# Patient Record
Sex: Male | Born: 1940 | Race: White | Hispanic: No | Smoking: Former smoker
Health system: Southern US, Community
[De-identification: ages and names within clinical notes are randomized; demographics above are authoritative.]

## PROBLEM LIST (undated history)

## (undated) DIAGNOSIS — C44319 Basal cell carcinoma of skin of other parts of face: Secondary | ICD-10-CM

## (undated) DIAGNOSIS — M5136 Other intervertebral disc degeneration, lumbar region: Secondary | ICD-10-CM

## (undated) DIAGNOSIS — M545 Other chronic pain: Secondary | ICD-10-CM

## (undated) DIAGNOSIS — M1990 Unspecified osteoarthritis, unspecified site: Secondary | ICD-10-CM

## (undated) DIAGNOSIS — G8929 Other chronic pain: Secondary | ICD-10-CM

## (undated) DIAGNOSIS — R51 Headache: Secondary | ICD-10-CM

## (undated) DIAGNOSIS — R251 Tremor, unspecified: Secondary | ICD-10-CM

## (undated) DIAGNOSIS — M51369 Other intervertebral disc degeneration, lumbar region without mention of lumbar back pain or lower extremity pain: Secondary | ICD-10-CM

## (undated) DIAGNOSIS — E785 Hyperlipidemia, unspecified: Secondary | ICD-10-CM

## (undated) DIAGNOSIS — I714 Abdominal aortic aneurysm, without rupture, unspecified: Secondary | ICD-10-CM

## (undated) DIAGNOSIS — M6530 Trigger finger, unspecified finger: Secondary | ICD-10-CM

## (undated) DIAGNOSIS — R519 Headache, unspecified: Secondary | ICD-10-CM

## (undated) DIAGNOSIS — Z87442 Personal history of urinary calculi: Secondary | ICD-10-CM

## (undated) HISTORY — PX: MENISCUS REPAIR: SHX5179

## (undated) HISTORY — DX: Tremor, unspecified: R25.1

## (undated) HISTORY — PX: ABDOMINAL AORTIC ANEURYSM REPAIR: SUR1152

## (undated) HISTORY — DX: Low back pain: M54.5

## (undated) HISTORY — DX: Other chronic pain: M54.50

## (undated) HISTORY — DX: Unspecified osteoarthritis, unspecified site: M19.90

## (undated) HISTORY — DX: Trigger finger, unspecified finger: M65.30

## (undated) HISTORY — PX: COLONOSCOPY: SHX174

## (undated) HISTORY — DX: Abdominal aortic aneurysm, without rupture, unspecified: I71.40

## (undated) HISTORY — PX: MOHS SURGERY: SUR867

## (undated) HISTORY — DX: Other chronic pain: G89.29

## (undated) HISTORY — DX: Hyperlipidemia, unspecified: E78.5

## (undated) HISTORY — DX: Abdominal aortic aneurysm, without rupture: I71.4

---

## 2000-06-19 ENCOUNTER — Encounter: Admission: RE | Admit: 2000-06-19 | Discharge: 2000-06-19 | Payer: Self-pay | Admitting: Family Medicine

## 2000-06-19 ENCOUNTER — Encounter: Payer: Self-pay | Admitting: Family Medicine

## 2008-07-26 ENCOUNTER — Encounter: Admission: RE | Admit: 2008-07-26 | Discharge: 2008-07-26 | Payer: Self-pay | Admitting: Family Medicine

## 2008-08-17 ENCOUNTER — Ambulatory Visit: Payer: Self-pay | Admitting: Vascular Surgery

## 2008-10-11 ENCOUNTER — Encounter: Payer: Self-pay | Admitting: Emergency Medicine

## 2008-10-11 ENCOUNTER — Ambulatory Visit (HOSPITAL_COMMUNITY): Admission: AD | Admit: 2008-10-11 | Discharge: 2008-10-11 | Payer: Self-pay | Admitting: Urology

## 2008-10-26 ENCOUNTER — Ambulatory Visit (HOSPITAL_BASED_OUTPATIENT_CLINIC_OR_DEPARTMENT_OTHER): Admission: RE | Admit: 2008-10-26 | Discharge: 2008-10-26 | Payer: Self-pay | Admitting: Urology

## 2009-03-31 ENCOUNTER — Ambulatory Visit: Payer: Self-pay | Admitting: Vascular Surgery

## 2009-10-24 DIAGNOSIS — M1990 Unspecified osteoarthritis, unspecified site: Secondary | ICD-10-CM

## 2009-10-24 HISTORY — DX: Unspecified osteoarthritis, unspecified site: M19.90

## 2009-11-03 ENCOUNTER — Ambulatory Visit: Payer: Self-pay | Admitting: Vascular Surgery

## 2010-03-26 DIAGNOSIS — C44319 Basal cell carcinoma of skin of other parts of face: Secondary | ICD-10-CM

## 2010-03-26 HISTORY — DX: Basal cell carcinoma of skin of other parts of face: C44.319

## 2010-03-26 HISTORY — PX: MOHS SURGERY: SUR867

## 2010-04-16 ENCOUNTER — Encounter: Payer: Self-pay | Admitting: Vascular Surgery

## 2010-05-03 ENCOUNTER — Ambulatory Visit: Payer: Self-pay | Admitting: Vascular Surgery

## 2010-05-03 ENCOUNTER — Encounter (INDEPENDENT_AMBULATORY_CARE_PROVIDER_SITE_OTHER): Payer: Medicare Other

## 2010-05-03 ENCOUNTER — Ambulatory Visit (INDEPENDENT_AMBULATORY_CARE_PROVIDER_SITE_OTHER): Payer: Medicare Other | Admitting: Vascular Surgery

## 2010-05-03 DIAGNOSIS — I714 Abdominal aortic aneurysm, without rupture: Secondary | ICD-10-CM

## 2010-05-05 NOTE — Assessment & Plan Note (Addendum)
OFFICE VISIT  [name] [name], [name] [name] DOB:  02/14/1941                                       05/03/2010 KGMWN#:[id number]  I saw this patient in the office today for continued follow-up of his abdominal aortic aneurysm.  I last saw him in August of 2011.  At that time it measured 4.1 cm in maximum diameter.  Since I saw him last, he has had no abdominal or back pain.  PAST MEDICAL HISTORY:  This has not changed since I saw him last.  He has a history of mildly elevated cholesterol which has been well- controlled on his current medications.  He is followed by Dr. Buren Kos.  He had a history of some chronic low back pain in the past related to degenerative disk disease but lately his back has not been bothering him significantly.  SOCIAL HISTORY:  He is married.  He has 2 children.  He quit tobacco approximately 12 years ago.  REVIEW OF SYSTEMS:  CARDIOVASCULAR:  He had no chest pain, chest pressure, palpitations or arrhythmias.  He has had no claudication, rest pain or nonhealing ulcers.  He has had no history of DVT or phlebitis. PULMONARY:  He had no productive cough bronchitis, asthma or wheezing.  PHYSICAL EXAMINATION:  This is a pleasant 70 year old gentleman who appears his stated age.  Blood pressure is 115/76, heart rate is 64, respiratory rate is 12.  Lungs:  Clear bilaterally to auscultation without rales, rhonchi or wheezing.  Cardiovascular exam:  I do not detect any carotid bruits.  He has a regular rate and rhythm.  He has palpable femoral, popliteal, posterior tibial pulses bilaterally. Abdomen:  Soft and nontender with normal pitched bowel sounds.  His aneurysm is palpable and nontender.  Neurologic exam:  He has no focal weakness or paresthesias.  I did independently interpret the duplex of his aneurysm which shows that the maximum diameter is 4.14 cm.  This has not changed over the last 6 months.  Given that the aneurysm is stable in  size and is fairly small,  I think it is safe to extend his follow-up out to 1 year.  I have ordered a follow-up duplex of his aneurysm in 1 year.  I will plan on seeing him back in 2 years unless there has been any significant change in the size of his aneurysm in his 1 year follow-up visit.  He understands we would typically consider elective repair at 5.5 cm.    Di Kindle. Edilia Bo, M.D. Electronically Signed  CSD/MEDQ  D:  05/03/2010  T:  05/04/2010  Job:  [id number]  cc:   Kari Baars, M.D.

## 2010-05-10 NOTE — Procedures (Unsigned)
DUPLEX ULTRASOUND OF ABDOMINAL AORTA  INDICATION:  Follow up AAA.  HISTORY: Diabetes:  No. Cardiac:  No. Hypertension:  No. Smoking:  Quit 14 years ago. Connective Tissue Disorder: Family History:  No. Previous Surgery:  No.  DUPLEX EXAM:         AP (cm)                   TRANSVERSE (cm) Proximal             2.09 cm                   2.07 cm Mid                  3.76 cm                   4.14 cm Distal               2.27 cm                   2.78 cm Right Iliac          1.31 cm                   1.14 cm Left Iliac           1.11 cm                   1.09 cm  PREVIOUS:  Date: 11/03/2009  AP:  3.7  TRANSVERSE:  4.1  IMPRESSION: 1. Abdominal aortic aneurysm with largest diameter of 3.76 X 4.14 cm. 2. Appears stable in comparison to the previous study.  ___________________________________________ Di Kindle. Edilia Bo, M.D.  EM/MEDQ  D:  05/03/2010  T:  05/03/2010  Job:  [id number]

## 2010-07-01 LAB — URINALYSIS, MICROSCOPIC ONLY
Bilirubin Urine: NEGATIVE
Glucose, UA: NEGATIVE mg/dL
Ketones, ur: NEGATIVE mg/dL
Specific Gravity, Urine: 1.019 (ref 1.005–1.030)
pH: 5 (ref 5.0–8.0)

## 2010-07-01 LAB — POCT I-STAT 4, (NA,K, GLUC, HGB,HCT)
Hemoglobin: 15.3 g/dL (ref 13.0–17.0)
Sodium: 142 mEq/L (ref 135–145)

## 2010-07-02 LAB — URINALYSIS, ROUTINE W REFLEX MICROSCOPIC
Glucose, UA: NEGATIVE mg/dL
Ketones, ur: 15 mg/dL — AB
Protein, ur: NEGATIVE mg/dL
pH: 5 (ref 5.0–8.0)

## 2010-07-02 LAB — POCT I-STAT, CHEM 8
BUN: 21 mg/dL (ref 6–23)
Calcium, Ion: 1.11 mmol/L — ABNORMAL LOW (ref 1.12–1.32)
Chloride: 100 mEq/L (ref 96–112)
Creatinine, Ser: 1.7 mg/dL — ABNORMAL HIGH (ref 0.4–1.5)
Glucose, Bld: 162 mg/dL — ABNORMAL HIGH (ref 70–99)
TCO2: 25 mmol/L (ref 0–100)

## 2010-07-02 LAB — CBC
HCT: 44.6 % (ref 39.0–52.0)
Hemoglobin: 15.4 g/dL (ref 13.0–17.0)
RBC: 4.72 MIL/uL (ref 4.22–5.81)
RDW: 13.6 % (ref 11.5–15.5)

## 2010-07-02 LAB — DIFFERENTIAL
Basophils Absolute: 0 10*3/uL (ref 0.0–0.1)
Eosinophils Relative: 0 % (ref 0–5)
Lymphocytes Relative: 6 % — ABNORMAL LOW (ref 12–46)
Lymphs Abs: 0.8 10*3/uL (ref 0.7–4.0)
Monocytes Absolute: 0.6 10*3/uL (ref 0.1–1.0)
Monocytes Relative: 5 % (ref 3–12)
Neutro Abs: 12.4 10*3/uL — ABNORMAL HIGH (ref 1.7–7.7)

## 2010-07-02 LAB — URINE CULTURE: Culture: NO GROWTH

## 2010-07-02 LAB — URINE MICROSCOPIC-ADD ON

## 2010-08-08 NOTE — Op Note (Signed)
[name], [name]              ACCOUNT NO.:  [id number]   MEDICAL RECORD NO.:  [id number]          PATIENT TYPE:  AMB   LOCATION:  DAY                          FACILITY[facility]   PHYSICIAN:  Courtney Paris, M.D.DATE OF BIRTH:  [birth year]   DATE OF PROCEDURE:  10/11/2008  DATE OF DISCHARGE:  10/11/2008                               OPERATIVE REPORT   PREOPERATIVE DIAGNOSIS:  Left hydronephrosis number, left ureteral  calculus, abdominal aortic aneurysm.   POSTOPERATIVE DIAGNOSIS:  Left hydronephrosis number, left ureteral  calculus, abdominal aortic aneurysm.   OPERATION:  Cystoscopy, left retrograde pyelogram, insertion of left  ureteral stent.   ANESTHESIA:  General.   SURGEON:  Courtney Paris, M.D.   BRIEF HISTORY:  This 70 year old patient was referred from the emergency  room at [facility] where he had gone for continued left lower  abdominal pain.  He had been to Urgent Care the day before and thought  to have diverticulitis.  The pain, nausea, and vomiting continued.  CT  scan showed a 7-mm stone in the left mid to distal the ureter with  hydronephrosis and he was sent for evaluation.  He had not had previous  stones before but does have a fairly large abdominal aortic aneurysm.  He is admitted now for cystoscopy, stent insertion after retrograde.   The patient was placed on the operating table in the dorsal lithotomy  position after satisfactory induction of general anesthesia.  Time-out  was then performed and the patient and procedure then reconfirmed.  He  had been on Cipro, so IV Cipro was not given.  A 21 panendoscope was  passed down the urethra.  No anterior strictures were seen.  His  prostate was mildly enlarged.  The bladder was entered.  There were a  lot of yellowish stones that were layered in the bladder posteriorly.  The orifices looked normal.  No bladder mucosal lesions seen.  The left  ureteral orifice was catheterized with a 6  open-ended ureteral catheter  and an occlusive retrograde was done.  This showed some hydronephrosis  down to a junction at the middle and lower third of the ureter, where it  seemed to taper.  There was a filling defect up in the renal pelvis,  possibly the stone that was pushed back.  All this was seen under  fluoroscopy.   A guidewire was then passed through the open-ended ureteral stent, which  was then removed, and over the guidewire a 6-French x 24-cm length  double-J ureteral stent was then passed.  When the guidewire was  removed, there was a nice coil in the renal pelvis and one in the  bladder.  This was adjusted slightly with grasping forceps, the bladder  drained and a B and O suppository inserted, and the patient taken to the  recovery room in good condition to be later discharged as an outpatient.  He will come back in a week after being off aspirin and will see if we  can see a stone on a plain x-ray and then schedule lithotripsy if  appropriate.  The stone  may be uric acid since he those yellow crystals  in the bladder, but will have to see if we can see the stone on a KUB.     Courtney Paris, M.D.  Electronically Signed    HMK/MEDQ  D:  10/11/2008  T:  10/12/2008  Job:  [id number]

## 2010-08-08 NOTE — Op Note (Signed)
[name] [name], [name] [name]              ACCOUNT NO.:  [id number]   MEDICAL RECORD NO.:  [id number]          PATIENT TYPE:  AMB   LOCATION:  [facility]                         FACILITY:  [facility]   PHYSICIAN:  Courtney Paris, M.D.DATE OF BIRTH:  1940/03/29   DATE OF PROCEDURE:  10/26/2008  DATE OF DISCHARGE:                               OPERATIVE REPORT   PREOPERATIVE DIAGNOSIS:  Left ureteral stone.   POSTOPERATIVE DIAGNOSIS:  Left ureteral stone.   PROCEDURE:  Left retrograde pyelogram and ureteroscopy.   ANESTHESIA:  General.   SURGEON:  Courtney Paris, M.D.   BRIEF HISTORY:  This 70 year old patient is admitted with recurrent pain  from a radiolucent stone.  He first presented on 10/11/2008 with a 2-day  history of left flank pain, had hydronephrosis and a stone that had been  seen in the mid ureter on CT scan was not seen on retrograde.  The stent  was removed on October 19, 2008 and he did well for a couple of days but  then had a lot more pain in the last couple of days.  CT scan with the  stent showed a 5-mm proximal left ureteral stone.  Again, the stone was  not seen on a KUB today but he enters now for ureteroscopy and stone  removal if possible.   The patient was placed on the operating table in the dorsal lithotomy  position.  After satisfactory induction of general anesthesia, B and O  suppository was inserted and he was given IV Cipro.  Time-out was then  performed and the patient and the procedure were then reconfirmed.  A 21-  panendoscope was passed down the anterior urethra which was normal.  Posterior urethra showed mild BPH and the bladder was entered.  The  bladder was free of any mucosal lesions.  The left orifice was a little  bit edematous and slightly red but otherwise normal.  A 6 open-ended  ureteral catheter was inserted and an occlusive retrograde was done.   This demonstrated what looked like a possible filling defect and some  folds in the distal  left ureter just outside the ureteral vesicle  junction but no other filling defects within the ureter were seen.  A  guidewire was then passed up through the open-ended catheter under  fluoroscopy to the level of the kidney and the open-ended catheter was  then removed.  Over the guidewire, the inner cannula of a ureteral  access sheath was then used to dilate the distal ureter.   A 6 short ureteroscope was then passed into the ureter and all the way  up to the kidney.  No stone was seen.  The folds of tissue in the distal  left ureter were just that and no stone was seen.  Two complete passes  all the way up to the ureteropelvic junction was done, but again no  stone was seen.  The scope was removed, bladder drained and the  guidewire removed and no stent was left.  The patient was then taken to  the recovery room in good condition to be later discharged as  an  outpatient.  If he does have a stone, perhaps it will pass since he has  been dilated but no stone was seen within the ureter at this time.      Courtney Paris, M.D.  Electronically Signed     HMK/MEDQ  D:  10/26/2008  T:  10/26/2008  Job:  [id number]

## 2010-08-08 NOTE — H&P (Signed)
[name] [name], [name] [name]              ACCOUNT NO.:  [id number]   MEDICAL RECORD NO.:  [id number]          PATIENT TYPE:  AMB   LOCATION:  DAY                          FACILITY:  [facility]   PHYSICIAN:  Courtney Paris, M.D.DATE OF BIRTH:  09/11/1940   DATE OF ADMISSION:  10/11/2008  DATE OF DISCHARGE:                              HISTORY & PHYSICAL   This 70 year old patient was seen in Urgent Care on October 10, 2008 for  left lower quadrant pain thought to be diverticulitis.  When his pain,  nausea and vomiting continued he went back to the ER where a CT scan now  shows a 7 mm left mid to distal ureteral stone with hydronephrosis.  He  had never had stones before.  He was sent for evaluation.   His medicines include:  1. Simvastatin 25 mg every 3 days.  2. Baby aspirin.   ALLERGIES:  He has no allergies.   His only operations were knee operations times two many years ago.   REVIEW OF SYSTEMS:  A 12 point review of systems was negative.  No heart  or lung problems.  No GI complaints, no voiding symptoms.  No previous  stones.   FAMILY HISTORY AND SOCIAL HISTORY:  He is married, just retired a month  ago.   PHYSICAL EXAM:  VITAL SIGNS:  Stable.  He is a healthy white male in no  acute distress complaining of left flank pain.  HEENT:  Clear.  CHEST: Clear to percussion auscultation.  No murmurs.  ABDOMEN:  Soft with no CVA tenderness to fist percussion.  No abdominal  masses.  GENITALIA:  Normal.  Bilaterally descended testes.  Prostate small and  benign.  EXTREMITIES:  Negative.  No edema.  Good distal pulses.   CT scan was reviewed and results as above.   IMPRESSION:  1. Left mid ureteral stone with hydronephrosis.  2. Nausea and vomiting secondary to above.   Recommend cystoscopy, retrograde and stent tonight as an emergency.      Courtney Paris, M.D.  Electronically Signed     HMK/MEDQ  D:  10/11/2008  T:  10/11/2008  Job:  [id number]

## 2010-08-08 NOTE — Procedures (Signed)
DUPLEX ULTRASOUND OF ABDOMINAL AORTA   INDICATION:  Followup abdominal aortic aneurysm.   HISTORY:  Diabetes:  No.  Cardiac:  No.  Hypertension:  No.  Smoking:  Quit 13 years ago.  Connective Tissue Disorder:  Family History:  No.  Previous Surgery:  No.   DUPLEX EXAM:         AP (cm)                   TRANSVERSE (cm)  Proximal             1.9 cm                    1.9 cm  Mid                  1.9 cm                    1.9 cm  Distal               3.7 cm                    4.1 cm  Right Iliac          cm                        1.13 cm  Left Iliac           cm                        1.26 cm   PREVIOUS:  Date:  03/31/2009  AP:  3.78  TRANSVERSE:  4.49   IMPRESSION:  1. Abdominal aortic aneurysm with the largest diameter of 3.7 x 4.1      cm.  Doppler velocities in the aorta and common iliac arteries      appear to be normal.  2. No significant change from previous exam.   ___________________________________________  Di Kindle. Edilia Bo, M.D.   NT/MEDQ  D:  11/03/2009  T:  11/03/2009  Job:  [id number]

## 2010-08-08 NOTE — Procedures (Signed)
DUPLEX ULTRASOUND OF ABDOMINAL AORTA   INDICATION:  Followup abdominal aortic aneurysm.   HISTORY:  Diabetes:  No.  Cardiac:  No.  Hypertension:  No.  Smoking:  Previous.  Quit 12 years ago.  Connective Tissue Disorder:  Family History:  No.  Previous Surgery:  No.   DUPLEX EXAM:         AP (cm)                   TRANSVERSE (cm)  Proximal             2.08 cm                   1.89 cm  Mid                  3.78 cm                   4.49 cm  Distal               3.01 cm                   3.00 cm  Right Iliac          1.19 cm                   1.06 cm  Left Iliac           1.03 cm                   0.87 cm   PREVIOUS:  4.0 cm per CT   IMPRESSION:  There appears to be an abdominal aortic aneurysm noted with  largest measurement of 3.78 cm x 4.49 cm.   ___________________________________________  Di Kindle. Edilia Bo, M.D.   CB/MEDQ  D:  03/31/2009  T:  03/31/2009  Job:  [id number]

## 2010-08-08 NOTE — Assessment & Plan Note (Signed)
OFFICE VISIT   [name] [name], [name] [name]  DOB:  08/06/1940                                       11/03/2009  ZOXWR#:[id number]   I saw the patient in the office today for continued followup of his  abdominal aortic aneurysm.  I originally saw him in consultation in May  of 2010 with a 4.7 cm infrarenal abdominal aortic aneurysm.  His most  recent followup exam in January of this year showed that the aneurysm  had not changed in size and was 4.5 cm in maximum diameter.  He comes in  for a 6 month followup visit.  Since I saw him last he has had no  significant abdominal or back pain.  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  He has  had no pain, burning or itching associated with these small  varicosities.  There has been no change in his medical history.  He does  have a history of hypercholesterolemia which has been stable and is  followed closely by Dr. Clelia Croft.   REVIEW OF SYSTEMS:  CARDIOVASCULAR:  He has had no chest pain, chest  pressure, palpitations or arrhythmias.  He has had no history of  claudication, rest pain or nonhealing ulcers.  He has had no history of  stroke, TIAs or amaurosis fugax.  He has had no history of DVT or  phlebitis.  PULMONARY:  He has had no productive cough, bronchitis, asthma or  wheezing.   PHYSICAL EXAMINATION:  General:  This is a pleasant 70 year old  gentleman who appears his stated age.  Vital signs:  Blood pressure is  126/74, heart rate is 57, saturation 100%.  Lungs:  Clear bilaterally to  auscultation without rales, rhonchi or wheezing.  Cardiovascular:  I do  not detect any carotid bruits.  He has a regular rate and rhythm.  He  has palpable femoral, popliteal and pedal pulses bilaterally.  He has no  significant lower extremity swelling.  Abdomen:  Soft and nontender with  normal pitched bowel sounds.  His aneurysm is palpable and nontender.  Neurological:  He has no  focal weakness or paresthesias.   I did independently interpret his duplex of his aneurysm today which  shows that the maximum diameter is 4.1 cm.  This is slightly smaller  than previous study back in January when it was 4.5 cm.   Regardless, clearly the aneurysm has not increased in size at all.  He  understands we would normally not consider elective repair unless it  reached 5.5 cm in maximum diameter based on American Heart Association  recommendations.  Fortunately he is not a smoker.  I have asked to see  him back in 6 months and I have ordered a followup duplex at that time.  If the aneurysm remains 4.1 cm we might be able to stretch his followup  out to a year.  He knows to call sooner if he has problems.     Di Kindle. Edilia Bo, M.D.  Electronically Signed   CSD/MEDQ  D:  11/03/2009  T:  11/04/2009  Job:  [id number]   cc:   Kari Baars, M.D.

## 2010-08-08 NOTE — Assessment & Plan Note (Signed)
OFFICE VISIT   [name] [name], [name] [name]  DOB:  [birth year]                                       03/31/2009  ZOXWR#:[id number]   I saw the patient in the office today for continued followup of his  abdominal aortic aneurysm.  This is a pleasant 70 year old gentleman who  I originally saw in consultation on 08/17/2008 with a 4.7 cm infrarenal  abdominal aortic aneurysm.  This was discovered during a lifeline  screening study.  At that time I explained we generally would not  consider elective repair of the aneurysm unless it became symptomatic or  it was 5.5 cm in maximum diameter.  Since I saw him last he has had no  significant abdominal pain.  He has had no change in his chronic back  pain.  He does have some mild lower chronic back pain which has been  stable likely related to degenerative disk disease.   In addition, he has a history of mildly elevated cholesterol which is  easily controlled on his current medications and this too has been  stable.   SOCIAL HISTORY:  He is married.  He has two children.  He quit tobacco  12 years ago he states today.   REVIEW OF SYSTEMS:  CARDIOVASCULAR:  He has had no chest pain, chest  pressure, palpitations or arrhythmias.  He has had no claudication, rest  pain or nonhealing ulcers.  He has had no history of strokes, TIAs or  history of DVT or phlebitis.  NEUROLOGIC:  He has had no dizziness, blackouts, headaches or seizures.   PHYSICAL EXAMINATION:  General:  This is a pleasant 70 year old  gentleman who appears his stated age.  Vital signs:  His blood pressure  is 128/84, heart rate is 56, respiratory rate 16.  Lungs:  Are clear  bilaterally to auscultation without rales, rhonchi or wheezing.  Cardiovascular:  I do not detect any carotid bruits.  He has a regular  rate and rhythm without murmur appreciated.  He has palpable radial,  femoral, popliteal and pedal pulses bilaterally.  Abdomen:  Soft and  nontender.  His  aneurysm is easily palpable and nontender.  He has  normal pitched bowel sounds.  Neurologic:  He has no focal weakness or  paresthesias.   I have independently interpreted his duplex of his aneurysm which shows  that the maximum diameter is 4.49 cm and is thus not changed  significantly over the last 6 months.  His iliac arteries are not  aneurysmal.   I did explain we would not consider elective repair of the aneurysm  unless it reached 5.5 cm in maximum diameter.  I have recommend a  followup ultrasound in 6 months and I will see him back at that time.     Di Kindle. Edilia Bo, M.D.  Electronically Signed   CSD/MEDQ  D:  03/31/2009  T:  04/01/2009  Job:  [id number]   cc:   Kari Baars, M.D.

## 2010-08-08 NOTE — Consult Note (Signed)
NEW PATIENT CONSULTATION   [name], [name]  DOB:  04-22-1940                                       08/17/2008  ZOXWR#:[id number]   I saw the patient in the office today in consultation concerning a 4.7  cm infrarenal abdominal aortic aneurysm.  This is a pleasant 70 year old  gentleman who had undergone a lifeline screening study approximately 7  months ago which showed a small abdominal aortic aneurysm.  Dr. Arvilla Market  recommended a followup ultrasound which was done on 07/26/2008.  This  was done at [facility] and the maximum diameter of the aneurysm  was 4.7 cm.  He was sent for vascular consultation.  Of note, he has had  no significant abdominal or back pain.  He has no family history of  aneurysmal disease that he is aware of.   PAST MEDICAL HISTORY:  Is fairly unremarkable.  He does have some mildly  elevated cholesterol.  He denies any history of diabetes, hypertension,  history of previous myocardial infarction, history of congestive heart  failure or history of COPD.   FAMILY HISTORY:  There is no history of premature cardiovascular  disease.   SOCIAL HISTORY:  He is married and has two children.  He works in [employer].  He quit tobacco 10 years ago.   REVIEW OF SYSTEMS AND MEDICATIONS:  Are documented on the medical  history form in his chart.   PHYSICAL EXAMINATION:  This is a pleasant 70 year old gentleman who  appears his stated age.  His blood pressure is 130/80, heart rate is 65.  HEENT is unremarkable.  His neck is supple.  There is no cervical  lymphadenopathy.  I do not detect any carotid bruits.  Lungs are clear  bilaterally to auscultation.  On cardiac exam he has a regular rate and  rhythm.  His abdomen is soft and nontender.  His aneurysm is palpable  and nontender.  He has normal pitched bowel sounds.  He has palpable  femoral, popliteal, dorsalis pedis and posterior tibial pulses  bilaterally.  He has no evidence of  atheroembolic disease.  He has no  significant lower extremity swelling.  Neurological exam is nonfocal.   His ultrasound shows that the maximum diameter is 4.7 cm.   I have explained that based on American Heart Association recommendation  we would normally not recommend repair of an aneurysm in a normal risk  patient unless it reached 5.5 cm in maximum diameter.  I have explained  the risk for rupture with an aneurysm less than this size is quite small  and that the risk of operative repair of significant complications is 4-  5%.  If the aneurysm enlarges to 5.5 cm or greater then we would  recommend elective repair.  I have recommend a followup study in 6  months and I think we will get a CT scan at that time to further assess  the aneurysm.  If it does enlarge to greater than 5.5 cm we would  consider elective repair and this will give me the information to help  me determine if he might be a candidate for endovascular repair or might  require open repair.  I plan on seeing him back in 6 months.  He knows  to call sooner if he has problems.   Di Kindle. Edilia Bo, M.D.  Electronically Signed  CSD/MEDQ  D:  08/17/2008  T:  08/18/2008  Job:  [id number]   cc:   Donia Guiles, M.D.

## 2011-05-01 ENCOUNTER — Encounter (INDEPENDENT_AMBULATORY_CARE_PROVIDER_SITE_OTHER): Payer: Medicare Other | Admitting: *Deleted

## 2011-05-01 DIAGNOSIS — I714 Abdominal aortic aneurysm, without rupture, unspecified: Secondary | ICD-10-CM

## 2011-05-16 ENCOUNTER — Encounter: Payer: Self-pay | Admitting: Vascular Surgery

## 2011-05-16 NOTE — Procedures (Unsigned)
DUPLEX ULTRASOUND OF ABDOMINAL AORTA  INDICATION:  Followup AAA  HISTORY: Diabetes:  no Cardiac:  no Hypertension:  no Smoking:  Previous Connective Tissue Disorder: Family History:  No Previous Surgery:  no  DUPLEX EXAM:         AP (cm)                   TRANSVERSE (cm) Proximal             2.72 cm                   2.72 cm Mid                  4.08 cm                   4.3 cm Distal               2.25 cm                   2.3 cm Right Iliac          1.40 cm                   1.37 cm Left Iliac           1.23 cm                   1.26 cm  PREVIOUS:  Date: 05/03/2010  AP:  3.76  TRANSVERSE:  4.14  IMPRESSION:  Essentially stable AAA measuring 4.08 cm by 4.3 cm on today's exam with  slight growth since previous study one year ago.   ___________________________________________ Di Kindle. Edilia Bo, M.D.  LT/MEDQ  D:  05/02/2011  T:  05/02/2011  Job:  [id number]

## 2012-03-24 ENCOUNTER — Encounter: Payer: Self-pay | Admitting: Vascular Surgery

## 2012-05-02 ENCOUNTER — Other Ambulatory Visit: Payer: Self-pay | Admitting: *Deleted

## 2012-05-02 DIAGNOSIS — I714 Abdominal aortic aneurysm, without rupture: Secondary | ICD-10-CM

## 2012-05-07 ENCOUNTER — Ambulatory Visit: Payer: Medicare Other | Admitting: Vascular Surgery

## 2012-05-13 ENCOUNTER — Encounter: Payer: Self-pay | Admitting: Vascular Surgery

## 2012-05-14 ENCOUNTER — Ambulatory Visit (INDEPENDENT_AMBULATORY_CARE_PROVIDER_SITE_OTHER): Payer: Medicare Other | Admitting: Neurosurgery

## 2012-05-14 ENCOUNTER — Encounter (INDEPENDENT_AMBULATORY_CARE_PROVIDER_SITE_OTHER): Payer: Medicare Other | Admitting: *Deleted

## 2012-05-14 ENCOUNTER — Encounter: Payer: Self-pay | Admitting: Neurosurgery

## 2012-05-14 ENCOUNTER — Other Ambulatory Visit: Payer: Self-pay | Admitting: *Deleted

## 2012-05-14 VITALS — BP 145/92 | HR 60 | Resp 16 | Ht 65.0 in | Wt 172.0 lb

## 2012-05-14 DIAGNOSIS — I714 Abdominal aortic aneurysm, without rupture, unspecified: Secondary | ICD-10-CM | POA: Insufficient documentation

## 2012-05-14 NOTE — Progress Notes (Signed)
[facility] [place] AAA/Carotid Office Note  CC: AAA surveillance Referring Physician: Edilia Bo  History of Present Illness: 72 year old patient of Dr. Edilia Bo followed for known AAA. The patient denies any unusual abdominal or back pain. The patient denies any new medical diagnoses or recent surgery.  Past Medical History  Diagnosis Date  . AAA (abdominal aortic aneurysm)   . Hyperlipidemia   . Chronic low back pain   . Arthritis Aug. 2011    Degenerative disk disease- low back  . 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, [facility]    ROS: [x]  Positive   [ ]  Denies    General: [ ]  Weight loss, [ ]  Fever, [ ]  chills Neurologic: [ ]  Dizziness, [ ]  Blackouts, [ ]  Seizure [ ]  Stroke, [ ]  "Mini stroke", [ ]  Slurred speech, [ ]  Temporary blindness; [ ]  weakness in arms or legs, [ ]  Hoarseness Cardiac: [ ]  Chest pain/pressure, [ ]  Shortness of breath at rest [ ]  Shortness of breath with exertion, [ ]  Atrial fibrillation or irregular heartbeat Vascular: [ ]  Pain in legs with walking, [ ]  Pain in legs at rest, [ ]  Pain in legs at night,  [ ]  Non-healing ulcer, [ ]  Blood clot in vein/DVT,   Pulmonary: [ ]  Home oxygen, [ ]  Productive cough, [ ]  Coughing up blood, [ ]  Asthma,  [ ]  Wheezing Musculoskeletal:  [ ]  Arthritis, [ ]  Low back pain, [ ]  Joint pain Hematologic: [ ]  Easy Bruising, [ ]  Anemia; [ ]  Hepatitis Gastrointestinal: [ ]  Blood in stool, [ ]  Gastroesophageal Reflux/heartburn, [ ]  Trouble swallowing Urinary: [ ]  chronic Kidney disease, [ ]  on HD - [ ]  MWF or [ ]  TTHS, [ ]  Burning with urination, [ ]  Difficulty urinating Skin: [ ]  Rashes, [ ]  Wounds Psychological: [ ]  Anxiety, [ ]  Depression   Social History History  Substance Use Topics  . Smoking status: Former Smoker    Types: Cigarettes    Quit date: 03/27/1999  . Smokeless tobacco: Never Used  . Alcohol Use: 0.0 oz/week    2-3 Glasses of wine per week    Family History Family History  Problem Relation Age of Onset   . Cancer Mother     Ovarian  . Heart disease Father     Not on File  Current Outpatient Prescriptions  Medication Sig Dispense Refill  . aspirin 81 MG tablet Take 81 mg by mouth daily.      . cholecalciferol (VITAMIN D) 1000 UNITS tablet Take 1,000 Units by mouth daily.      . Garlic (GARLIQUE PO) Take by mouth.      Marland Kitchen glucosamine-chondroitin 500-400 MG tablet Take 1 tablet by mouth 3 (three) times daily.      Marland Kitchen ibuprofen (ADVIL,MOTRIN) 100 MG tablet Take 100 mg by mouth every 6 (six) hours as needed.      . Multiple Vitamin (MULTI-VITAMIN DAILY PO) Take by mouth.      . Omega-3 Fatty Acids (FISH OIL) 1200 MG CAPS Take by mouth.      . simvastatin (ZOCOR) 20 MG tablet Take 20 mg by mouth every other day.       No current facility-administered medications for this visit.    Physical Examination  Filed Vitals:   05/14/12 0947  BP: 145/92  Pulse: 60  Resp: 16    Body mass index is 28.62 kg/(m^2).  General:  WDWN in NAD Gait: Normal HEENT: WNL Eyes: Pupils equal Pulmonary: normal  non-labored breathing , without Rales, rhonchi,  wheezing Cardiac: RRR, without  Murmurs, rubs or gallops; Abdomen: soft, NT, no masses Skin: no rashes, ulcers noted  Vascular Exam Pulses: 3+ radial pulses bilaterally, palpable femoral pulses bilaterally, there is a mild pulsatile abdominal mass palpated Carotid bruits: Carotid pulses to auscultation no bruits are heard Extremities without ischemic changes, no Gangrene , no cellulitis; no open wounds;  Musculoskeletal: no muscle wasting or atrophy   Neurologic: A&O X 3; Appropriate Affect ; SENSATION: normal; MOTOR FUNCTION:  moving all extremities equally. Speech is fluent/normal  Non-Invasive Vascular Imaging AAA duplex shows a maximum diameter of 4.5 AP by 4.7 transverse which is an increase from 4.0 in February 2013.  ASSESSMENT/PLAN: Asymptomatic patient will followup in 6 months with repeat AAA duplex. The patient knows the signs and  symptoms of rupture and knows to call 911 should this occur. The patient's questions were encouraged and answered, he is in agreement with this plan.  Lauree Chandler ANP   Clinic MD: Edilia Bo

## 2012-10-24 HISTORY — PX: TOOTH EXTRACTION: SUR596

## 2012-11-18 ENCOUNTER — Encounter: Payer: Self-pay | Admitting: Vascular Surgery

## 2012-11-19 ENCOUNTER — Encounter: Payer: Self-pay | Admitting: Family

## 2012-11-19 ENCOUNTER — Encounter (INDEPENDENT_AMBULATORY_CARE_PROVIDER_SITE_OTHER): Admitting: *Deleted

## 2012-11-19 ENCOUNTER — Ambulatory Visit (INDEPENDENT_AMBULATORY_CARE_PROVIDER_SITE_OTHER): Payer: BLUE CROSS/BLUE SHIELD | Admitting: Family

## 2012-11-19 VITALS — BP 145/90 | HR 62 | Resp 16 | Ht 65.0 in | Wt 172.0 lb

## 2012-11-19 DIAGNOSIS — I714 Abdominal aortic aneurysm, without rupture: Secondary | ICD-10-CM | POA: Insufficient documentation

## 2012-11-19 NOTE — Patient Instructions (Addendum)
Abdominal Aortic Aneurysm Aneurysms are weak places in the wall of an artery. An aneurysm bulges out like a balloon. The aorta is the main vessel in the chest and abdomen. An abdominal aortic aneurysm (AAA) is located in the abdomen. The aorta carries blood from your heart to the rest of the body. An aneurysm may be detected by physical exam. Imaging studies such as x-rays, CT, or ultrasound may also be used. Aneurysms usually develop with aging and the build-up of plaque in the artery wall. You are more likely to develop an aneurysm if you have a family history of aneurysms, if you smoke cigarettes, and\or if you have high blood pressure. When an abdominal aortic aneurysm is small, it may cause no problems. Those that measure less than 2 inches (5 cm) across may not require immediate surgery. Larger aneurysms are more likely to leak or rupture. When an aneurysm leaks or ruptures, it is a life-threatening emergency. Signs of a leaky or ruptured aneurysm include abdominal, flank, or back pain, severe weakness, fainting, and shock. Some people simply feel like their feet are asleep or their legs look pale. Be sure to follow-up with your caregiver as recommended so your AAA may be properly monitored.  SEEK IMMEDIATE MEDICAL CARE IF:  You develop severe abdominal, flank or back pain  You pass out or become light headed  You suddenly become weak, pale, or sweaty.  You develop pain in your legs, your legs lose their color or you cannot move them.  You develop blood in your stools.  You develop blood in your urine. Document Released: 03/12/2005 Document Revised: 06/04/2011 Document Reviewed: 08/05/2008 ExitCare Patient Information 2014 [employer].  

## 2012-11-19 NOTE — Progress Notes (Signed)
[facility] [place]  Established Abdominal Aortic Aneurysm  History of Present Illness  [name] [name] is a 72 y.o. (09/21/1940) male who presents with chief complaint: follow up for AAA surveillance; this was found incidentally on a Lifescan years ago. 6 months ago AAA duplex showed a maximum diameter of 4.5 AP by 4.7 transverse which is an increase from 4.0 in February 2013. The patient does not have back or abdominal pain.  The patient is not a smoker.    Pt Diabetic: No Pt smoker: former smoker, quit 15 years ago  Past Medical History  Diagnosis Date  . AAA (abdominal aortic aneurysm)   . Hyperlipidemia   . Chronic low back pain   . Arthritis Aug. 2011    Degenerative disk disease- low back  . 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 Joint replacement  1968 and 1969    Knee    Social History History   Social History  . Marital Status: Married    Spouse Name: N/A    Number of Children: N/A  . Years of Education: N/A   Occupational History  . Not on file.   Social History Main Topics  . Smoking status: Former Smoker    Types: Cigarettes    Quit date: 03/27/1999  . Smokeless tobacco: Never Used  . Alcohol Use: 0.0 oz/week    2-3 Glasses of wine per week  . Drug Use: No  . Sexual Activity: Not on file   Other Topics Concern  . Not on file   Social History Narrative  . No narrative on file   Family History Family History  Problem Relation Age of Onset  . Cancer Mother     Ovarian  . Heart disease Father     Current Outpatient Prescriptions on File Prior to Visit  Medication Sig Dispense Refill  . aspirin 81 MG tablet Take 81 mg by mouth daily.      . cholecalciferol (VITAMIN D) 1000 UNITS tablet Take 1,000 Units by mouth daily.      . Garlic (GARLIQUE PO) Take by mouth.      Marland Kitchen glucosamine-chondroitin 500-400 MG tablet Take 1 tablet by mouth 3 (three) times daily.      Marland Kitchen ibuprofen  (ADVIL,MOTRIN) 100 MG tablet Take 100 mg by mouth every 6 (six) hours as needed.      . Multiple Vitamin (MULTI-VITAMIN DAILY PO) Take by mouth.      . Omega-3 Fatty Acids (FISH OIL) 1200 MG CAPS Take by mouth.      . simvastatin (ZOCOR) 20 MG tablet Take 20 mg by mouth every other day.       No current facility-administered medications on file prior to visit.   Not on File  ROS: [x]  Positive   [ ]  Negative   [ ]  All sytems reviewed and are negative  General: [ ]  Weight loss, [ ]  Fever, [ ]  chills Neurologic: [ ]  Dizziness, [ ]  Blackouts, [ ]  Seizure [ ]  Stroke, [ ]  "Mini stroke", [ ]  Slurred speech, [ ]  Temporary blindness; [ ]  weakness in arms or legs, [ ]  Hoarseness Cardiac: [ ]  Chest pain/pressure, [ ]  Shortness of breath at rest [ ]  Shortness of breath with exertion, [ ]  Atrial fibrillation or irregular heartbeat Vascular: [ ]  Pain in legs with walking, [ ]  Pain in legs at rest, [ ]  Pain in legs at night,  [ ]   Non-healing ulcer, [ ]  Blood clot in vein/DVT,   Pulmonary: [ ]  Home oxygen, [ ]  Productive cough, [ ]  Coughing up blood, [ ]  Asthma,  [ ]  Wheezing Musculoskeletal:  [ ]  Arthritis, [ ]  Low back pain, [ ]  Joint pain Hematologic: [ ]  Easy Bruising, [ ]  Anemia; [ ]  Hepatitis Gastrointestinal: [ ]  Blood in stool, [ ]  Gastroesophageal Reflux/heartburn, [ ]  Trouble swallowing Urinary: [ ]  chronic Kidney disease, [ ]  on HD - [ ]  MWF or [ ]  TTHS, [ ]  Burning with urination, [ ]  Difficulty urinating Skin: [ ]  Rashes, [ ]  Wounds Psychological: [ ]  Anxiety, [ ]  Depression  Physical Examination  Filed Vitals:   11/19/12 0912  BP: 145/90  Pulse: 62  Resp: 16   Filed Weights   11/19/12 0912  Weight: 172 lb (78.019 kg)   Body mass index is 28.62 kg/(m^2).  General: A&O x 3, WD, fit appearing.  Pulmonary: Sym exp, good air movt, CTAB, no rales, rhonchi, & wheezing.   Cardiac: RRR, Nl S1, S2, no Murmurs.  Carotid Bruits Left Right   Negative Negative                              VASCULAR EXAM:                                                                                                         LE Pulses LEFT RIGHT       FEMORAL   palpable   palpable        POPLITEAL   palpable    palpable       POSTERIOR TIBIAL   palpable    palpable        DORSALIS PEDIS      ANTERIOR TIBIAL  palpable    palpable   AORTA palpable N/A     Gastrointestinal: soft, NTND, -G/R, - HSM, palpable aorta and possibly palpable left iliac artery but not a pulsatile mass, - CVAT B.  Musculoskeletal: M/S 5/5 throughout. Extremities without ischemic changes.  Neurologic: CN 2-12 intact. Pain and light touch intact in extremities. Motor exam as listed above. Mild head tremor.  Non-Invasive Vascular Imaging  AAA Duplex (11/19/2012)  Previous size: 4.5 x 4.7 cm (Date: 05/14/2012)  Current size:  4.43 x 4.51 cm (Date: 11/19/2012)  Medical Decision Making  The patient is a 72 y.o. male who presents with: asymptomatic AAA with no increase in size. Aorta and possibly left iliac artery palpable; will include iliac arteries with aortic Duplex in 6 months.   Based on this patient's exam and diagnostic studies, he needs follow up Duplex of aorta and iliac arteries in 6 months.  The threshold for repair is AAA size > 5.5 cm, growth > 1 cm/yr, and symptomatic status.  The patient will follow up in 6 months with the following studies aorto-iliac Duplex.    I emphasized the importance of maximal medical management including strict control of blood pressure, blood glucose, and lipid levels, antiplatelet  agents, obtaining regular exercise, and continued cessation of smoking.    The patient was given information about AAA.  Thank you for allowing [place] to participate in this patient's care.  Charisse March, RN, MSN, FNP-C [facility] [place] Office: [phone]  Clinic Physician: Edilia Bo  11/19/2012, 9:12 AM

## 2013-01-29 ENCOUNTER — Other Ambulatory Visit: Payer: Self-pay

## 2013-04-23 ENCOUNTER — Other Ambulatory Visit: Payer: Self-pay | Admitting: Family

## 2013-04-23 DIAGNOSIS — I714 Abdominal aortic aneurysm, without rupture, unspecified: Secondary | ICD-10-CM

## 2013-05-20 ENCOUNTER — Ambulatory Visit: Payer: Medicare Other | Admitting: Neurosurgery

## 2013-05-27 ENCOUNTER — Ambulatory Visit: Payer: Medicare Other | Admitting: Family

## 2013-05-27 ENCOUNTER — Other Ambulatory Visit (HOSPITAL_COMMUNITY): Payer: Medicare Other

## 2013-06-02 ENCOUNTER — Encounter: Payer: Self-pay | Admitting: Family

## 2013-06-03 ENCOUNTER — Encounter: Payer: Self-pay | Admitting: Family

## 2013-06-03 ENCOUNTER — Ambulatory Visit (INDEPENDENT_AMBULATORY_CARE_PROVIDER_SITE_OTHER): Payer: Medicare Other | Admitting: Family

## 2013-06-03 ENCOUNTER — Ambulatory Visit (HOSPITAL_COMMUNITY)
Admission: RE | Admit: 2013-06-03 | Discharge: 2013-06-03 | Disposition: A | Discharge status: Home / Self Care | Payer: Medicare Other | Source: Ambulatory Visit | Attending: Family | Admitting: Family

## 2013-06-03 VITALS — BP 151/91 | HR 65 | Resp 16 | Ht 65.0 in | Wt 177.0 lb

## 2013-06-03 DIAGNOSIS — I714 Abdominal aortic aneurysm, without rupture, unspecified: Secondary | ICD-10-CM | POA: Insufficient documentation

## 2013-06-03 NOTE — Addendum Note (Signed)
Addended by: Dorthula Rue L on: 06/03/2013 02:55 PM   Modules accepted: Orders

## 2013-06-03 NOTE — Progress Notes (Signed)
[facility] [place]  Established Abdominal Aortic Aneurysm  History of Present Illness  [name] [name] is a 73 y.o. (February 02, 1941) male patient of Dr. Scot Dock male who presents with chief complaint: follow up for AAA surveillance; this was found incidentally on a Lifescan years ago.  6 months ago AAA duplex showed a maximum diameter of 4.5 AP by 4.8 cm which is an increase from 4.0 in February 2013.  The patient does not have back or abdominal pain. The patient is not a smoker. He exercises for an hour at least 3 days/week.   The patient denies claudication in legs with walking. The patient denies history of stroke or TIA symptoms. He denies any new medical or surgical history. He denies family history of aneurysms.  Pt Diabetic: No Pt smoker: former smoker, quit about 15 years ago  Past Medical History  Diagnosis Date  . AAA (abdominal aortic aneurysm)   . Hyperlipidemia   . Chronic low back pain   . Arthritis Aug. 2011    Degenerative disk disease- low back  . 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, [facility]   Past Surgical History  Procedure Laterality Date  . Joint replacement  1968 and 1969    Knee   . Tooth extraction  Aug. 2014   Social History History   Social History  . Marital Status: Married    Spouse Name: N/A    Number of Children: N/A  . Years of Education: N/A   Occupational History  . Not on file.   Social History Main Topics  . Smoking status: Former Smoker    Types: Cigarettes    Quit date: 03/27/1999  . Smokeless tobacco: Never Used  . Alcohol Use: 0.0 oz/week    2-3 Glasses of wine per week  . Drug Use: No  . Sexual Activity: Not on file   Other Topics Concern  . Not on file   Social History Narrative  . No narrative on file   Family History Family History  Problem Relation Age of Onset  . Cancer Mother     Ovarian  . Heart disease Father     Current Outpatient Prescriptions on File Prior to Visit  Medication Sig Dispense  Refill  . aspirin 81 MG tablet Take 81 mg by mouth daily.      . cholecalciferol (VITAMIN D) 1000 UNITS tablet Take 1,000 Units by mouth daily.      . Garlic (GARLIQUE PO) Take by mouth.      Marland Kitchen glucosamine-chondroitin 500-400 MG tablet Take 1 tablet by mouth 3 (three) times daily.      Marland Kitchen ibuprofen (ADVIL,MOTRIN) 100 MG tablet Take 100 mg by mouth every 6 (six) hours as needed.      . Multiple Vitamin (MULTI-VITAMIN DAILY PO) Take by mouth.      . Omega-3 Fatty Acids (FISH OIL) 1200 MG CAPS Take by mouth.      . simvastatin (ZOCOR) 20 MG tablet Take 20 mg by mouth every other day.       No current facility-administered medications on file prior to visit.   Not on File  ROS: See HPI for pertinent positives and negatives.  Physical Examination  Filed Vitals:   06/03/13 0915  BP: 151/91  Pulse: 65  Resp: 16  Height: 5\' 5"  (1.651 m)  Weight: 177 lb (80.287 kg)  SpO2: 96%   Body mass index is 29.45 kg/(m^2).  General: A&O x 3, WD.  Pulmonary: Sym exp, good air  movt, CTAB, no rales, rhonchi, or wheezing.  Cardiac: RRR, Nl S1, S2, no detected murmur.   Carotid Bruits Left Right   Negative Negative   Aorta is  palpable Radial pulses are are 2+ palpable and =.                          VASCULAR EXAM:                                                                                                         LE Pulses LEFT RIGHT       FEMORAL   palpable   palpable        POPLITEAL  not palpable   not palpable       POSTERIOR TIBIAL  not palpable    palpable        DORSALIS PEDIS      ANTERIOR TIBIAL  palpable   palpable      Gastrointestinal: soft, NTND, -G/R, - HSM, - masses, - CVAT B.  Musculoskeletal: M/S 5/5 throughout, Extremities without ischemic changes.  Neurologic: CN 2-12 intact, Pain and light touch intact in extremities are intact except, Motor exam as listed above.  Non-Invasive Vascular Imaging  AAA Duplex (06/03/2013)  Previous size: 4.4 x 4.5 cm  (Date: 11/19/2012)  Current size:  4.5 x 4.8 cm (Date: 06/03/2013)  Medical Decision Making  The patient is a 73 y.o. male who presents with asymptomatic AAA with insignificant increase in size.   Based on this patient's exam and diagnostic studies, the patient will follow up in 6 months  with the following studies: AAA Duplex.  The threshold for repair is AAA size > 5.5 cm, growth > 1 cm/yr, and symptomatic status.  I emphasized the importance of maximal medical management including strict control of blood pressure, blood glucose, and lipid levels, antiplatelet agents, obtaining regular exercise, and continued cessation of smoking.   The patient was given information about AAA including signs, symptoms, treatment, and how to minimize the risk of enlargement and rupture of aneurysms.    The patient was advised to call 911 should the patient experience sudden onset abdominal or back pain.   Thank you for allowing [place] to participate in this patient's care.  Clemon Chambers, RN, MSN, FNP-C [facility] [place] Office: [phone]  Clinic Physician: Scot Dock  06/03/2013, 9:23 AM

## 2013-06-03 NOTE — Patient Instructions (Signed)

## 2013-12-01 ENCOUNTER — Encounter: Payer: Self-pay | Admitting: Family

## 2013-12-02 ENCOUNTER — Encounter: Payer: Self-pay | Admitting: Family

## 2013-12-02 ENCOUNTER — Ambulatory Visit (HOSPITAL_COMMUNITY)
Admission: RE | Admit: 2013-12-02 | Discharge: 2013-12-02 | Disposition: A | Discharge status: Home / Self Care | Payer: Medicare Other | Source: Ambulatory Visit | Attending: Family | Admitting: Family

## 2013-12-02 ENCOUNTER — Ambulatory Visit (INDEPENDENT_AMBULATORY_CARE_PROVIDER_SITE_OTHER): Payer: Medicare Other | Admitting: Family

## 2013-12-02 VITALS — BP 135/86 | HR 60 | Resp 14 | Ht 65.0 in | Wt 167.0 lb

## 2013-12-02 DIAGNOSIS — I714 Abdominal aortic aneurysm, without rupture, unspecified: Secondary | ICD-10-CM | POA: Insufficient documentation

## 2013-12-02 NOTE — Patient Instructions (Signed)
Abdominal Aortic Aneurysm An aneurysm is a weakened or damaged part of an artery wall that bulges from the normal force of blood pumping through the body. An abdominal aortic aneurysm is an aneurysm that occurs in the lower part of the aorta, the main artery of the body.  The major concern with an abdominal aortic aneurysm is that it can enlarge and burst (rupture) or blood can flow between the layers of the wall of the aorta through a tear (aorticdissection). Both of these conditions can cause bleeding inside the body and can be life threatening unless diagnosed and treated promptly. CAUSES  The exact cause of an abdominal aortic aneurysm is unknown. Some contributing factors are:   A hardening of the arteries caused by the buildup of fat and other substances in the lining of a blood vessel (arteriosclerosis).  Inflammation of the walls of an artery (arteritis).   Connective tissue diseases, such as Marfan syndrome.   Abdominal trauma.   An infection, such as syphilis or staphylococcus, in the wall of the aorta (infectious aortitis) caused by bacteria. RISK FACTORS  Risk factors that contribute to an abdominal aortic aneurysm may include:  Age older than 60 years.   High blood pressure (hypertension).  Male gender.  Ethnicity (white race).  Obesity.  Family history of aneurysm (first degree relatives only).  Tobacco use. PREVENTION  The following healthy lifestyle habits may help decrease your risk of abdominal aortic aneurysm:  Quitting smoking. Smoking can raise your blood pressure and cause arteriosclerosis.  Limiting or avoiding alcohol.  Keeping your blood pressure, blood sugar level, and cholesterol levels within normal limits.  Decreasing your salt intake. In somepeople, too much salt can raise blood pressure and increase your risk of abdominal aortic aneurysm.  Eating a diet low in saturated fats and cholesterol.  Increasing your fiber intake by including  whole grains, vegetables, and fruits in your diet. Eating these foods may help lower blood pressure.  Maintaining a healthy weight.  Staying physically active and exercising regularly. SYMPTOMS  The symptoms of abdominal aortic aneurysm may vary depending on the size and rate of growth of the aneurysm.Most grow slowly and do not have any symptoms. When symptoms do occur, they may include:  Pain (abdomen, side, lower back, or groin). The pain may vary in intensity. A sudden onset of severe pain may indicate that the aneurysm has ruptured.  Feeling full after eating only small amounts of food.  Nausea or vomiting or both.  Feeling a pulsating lump in the abdomen.  Feeling faint or passing out. DIAGNOSIS  Since most unruptured abdominal aortic aneurysms have no symptoms, they are often discovered during diagnostic exams for other conditions. An aneurysm may be found during the following procedures:  Ultrasonography (A one-time screening for abdominal aortic aneurysm by ultrasonography is also recommended for all men aged 65-75 years who have ever smoked).  X-ray exams.  A computed tomography (CT).  Magnetic resonance imaging (MRI).  Angiography or arteriography. TREATMENT  Treatment of an abdominal aortic aneurysm depends on the size of your aneurysm, your age, and risk factors for rupture. Medication to control blood pressure and pain may be used to manage aneurysms smaller than 6 cm. Regular monitoring for enlargement may be recommended by your caregiver if:  The aneurysm is 3-4 cm in size (an annual ultrasonography may be recommended).  The aneurysm is 4-4.5 cm in size (an ultrasonography every 6 months may be recommended).  The aneurysm is larger than 4.5 cm in   size (your caregiver may ask that you be examined by a vascular surgeon). If your aneurysm is larger than 6 cm, surgical repair may be recommended. There are two main methods for repair of an aneurysm:   Endovascular  repair (a minimally invasive surgery). This is done most often.  Open repair. This method is used if an endovascular repair is not possible. Document Released: 12/20/2004 Document Revised: 07/07/2012 Document Reviewed: 04/11/2012 Exit[employer] Patient Information 2015 [employer]. This information is not intended to replace advice given to you by your health care provider. Make sure you discuss any questions you have with your health care provider.  

## 2013-12-02 NOTE — Addendum Note (Signed)
Addended by: Mena Goes on: 12/02/2013 04:08 PM   Modules accepted: Orders

## 2013-12-02 NOTE — Progress Notes (Signed)
[facility] [place]  Established Abdominal Aortic Aneurysm  History of Present Illness  [name] [name] is a 73 y.o. (March 07, 1941) male patient of Dr. Scot Dock male who presents with chief complaint: follow up for AAA surveillance; this was found incidentally on a Lifescan years ago.  6 months ago AAA duplex showed a maximum diameter of 4.5 by 4.8 cm which is no increase in size from 6 months prior. The patient does not have back or abdominal pain. The patient is not a smoker.  He exercises for an hour at least 3 days/week.  The patient denies claudication in legs with walking.  The patient denies history of stroke or TIA symptoms.  He denies any new medical or surgical history.  He denies family history of aneurysms.   Pt Diabetic: No  Pt smoker: former smoker, quit about 2000    Past Medical History  Diagnosis Date  . AAA (abdominal aortic aneurysm)   . Hyperlipidemia   . Chronic low back pain   . Arthritis Aug. 2011    Degenerative disk disease- low back  . 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 Joint replacement  1968 and 1969    Knee   . Tooth extraction  Aug. 2014   Social History History   Social History  . Marital Status: Married    Spouse Name: N/A    Number of Children: N/A  . Years of Education: N/A   Occupational History  . Not on file.   Social History Main Topics  . Smoking status: Former Smoker    Types: Cigarettes    Quit date: 03/27/1999  . Smokeless tobacco: Never Used  . Alcohol Use: 0.0 oz/week    2-3 Glasses of wine per week  . Drug Use: No  . Sexual Activity: Not on file   Other Topics Concern  . Not on file   Social History Narrative  . No narrative on file   Family History Family History  Problem Relation Age of Onset  . Cancer Mother     Ovarian  . Heart disease Father     Current Outpatient Prescriptions on File Prior to Visit  Medication Sig Dispense Refill   . aspirin 81 MG tablet Take 81 mg by mouth daily.      . cholecalciferol (VITAMIN D) 1000 UNITS tablet Take 1,000 Units by mouth daily.      . Garlic (GARLIQUE PO) Take by mouth.      Marland Kitchen glucosamine-chondroitin 500-400 MG tablet Take 1 tablet by mouth 3 (three) times daily.      Marland Kitchen ibuprofen (ADVIL,MOTRIN) 100 MG tablet Take 100 mg by mouth every 6 (six) hours as needed.      . Multiple Vitamin (MULTI-VITAMIN DAILY PO) Take by mouth.      . Omega-3 Fatty Acids (FISH OIL) 1200 MG CAPS Take by mouth.      . simvastatin (ZOCOR) 20 MG tablet Take 20 mg by mouth every other day.       No current facility-administered medications on file prior to visit.   Not on File  ROS: See HPI for pertinent positives and negatives.  Physical Examination  Filed Vitals:   12/02/13 0905  BP: 135/86  Pulse: 60  Resp: 14  Height: 5\' 5"  (1.651 m)  Weight: 167 lb (75.751 kg)  SpO2: 96%   Body mass index is 27.79 kg/(m^2).  General: A&O x 3, WD.  Pulmonary:  Sym exp, good air movt, CTAB, no rales, rhonchi, or wheezing.  Cardiac: RRR, Nl S1, S2, no detected murmur.  Carotid Bruits  Left  Right    Negative  Negative   Aorta is palpable  Radial pulses are are 2+ palpable and =.   VASCULAR EXAM:  LE Pulses  LEFT  RIGHT   FEMORAL  palpable  palpable   POPLITEAL  not palpable   2+palpable   POSTERIOR TIBIAL  2+ palpable  2+palpable   DORSALIS PEDIS  ANTERIOR TIBIAL  2+palpable  2+palpable    Gastrointestinal: soft, NTND, -G/R, - HSM, - masses, - CVAT B.  Musculoskeletal: M/S 5/5 throughout, Extremities without ischemic changes.  Neurologic: CN 2-12 intact, Pain and light touch intact in extremities are intact except, Motor exam as listed above.   Non-Invasive Vascular Imaging  AAA Duplex (12/02/2013)  Previous size: 4.8 cm (Date: 06/03/13)  Current size:  4.8 cm (Date: 12/02/2013)  Medical Decision Making  The patient is a 73 y.o. male who presents with asymptomatic AAA with no increase in  size.   Based on this patient's exam and diagnostic studies, the patient will follow up in 6 months  with the following studies: AAA Duplex.  Consideration for repair of AAA would be made when the size is 5.5 cm, growth > 1 cm/yr, and symptomatic status.  I emphasized the importance of maximal medical management including strict control of blood pressure, blood glucose, and lipid levels, antiplatelet agents, obtaining regular exercise, and continued cessation of smoking.   The patient was given information about AAA including signs, symptoms, treatment, and how to minimize the risk of enlargement and rupture of aneurysms.    The patient was advised to call 911 should the patient experience sudden onset abdominal or back pain.   Thank you for allowing [place] to participate in this patient's care.  Clemon Chambers, RN, MSN, FNP-C [facility] [place] Office: [phone]  Clinic Physician: Scot Dock  12/02/2013, 9:21 AM

## 2014-01-08 ENCOUNTER — Other Ambulatory Visit: Payer: Self-pay

## 2014-03-10 ENCOUNTER — Other Ambulatory Visit: Payer: Self-pay | Admitting: Family Medicine

## 2014-03-10 ENCOUNTER — Ambulatory Visit
Admission: RE | Admit: 2014-03-10 | Discharge: 2014-03-10 | Disposition: A | Discharge status: Home / Self Care | Payer: Medicare Other | Source: Ambulatory Visit | Attending: Family Medicine | Admitting: Family Medicine

## 2014-03-10 DIAGNOSIS — M542 Cervicalgia: Secondary | ICD-10-CM

## 2014-05-25 ENCOUNTER — Encounter: Payer: Self-pay | Admitting: Family

## 2014-05-26 ENCOUNTER — Encounter: Payer: Self-pay | Admitting: Family

## 2014-05-26 ENCOUNTER — Ambulatory Visit (HOSPITAL_COMMUNITY)
Admission: RE | Admit: 2014-05-26 | Discharge: 2014-05-26 | Disposition: A | Discharge status: Home / Self Care | Payer: Medicare Other | Source: Ambulatory Visit | Attending: Family | Admitting: Family

## 2014-05-26 ENCOUNTER — Ambulatory Visit (INDEPENDENT_AMBULATORY_CARE_PROVIDER_SITE_OTHER): Payer: Medicare Other | Admitting: Family

## 2014-05-26 VITALS — BP 150/90 | HR 62 | Resp 16 | Ht 65.0 in | Wt 161.0 lb

## 2014-05-26 DIAGNOSIS — I714 Abdominal aortic aneurysm, without rupture, unspecified: Secondary | ICD-10-CM

## 2014-05-26 DIAGNOSIS — Z87891 Personal history of nicotine dependence: Secondary | ICD-10-CM | POA: Diagnosis not present

## 2014-05-26 NOTE — Progress Notes (Signed)
[facility] [place]  Established Abdominal Aortic Aneurysm  History of Present Illness  [name] [name] is a 74 y.o. (Jan 18, 1941) male patient of Dr. Scot Dock who presents with chief complaint: follow up for AAA surveillance; this was found incidentally on a Lifescan years ago.  A 2015 AAA duplex showed a maximum diameter of 4.5 by 4.8 cm which is no increase in size from 6 months prior. The patient does not have back or abdominal pain. The patient is a former smoker.  He exercises for an hour at least 3 days/week.  The patient denies claudication in legs with walking.  The patient denies history of stroke or TIA symptoms.  He reports new medical or surgical history: his urologist placed him on an antibiotic recently.  He denies family history of aneurysms.   Pt Diabetic: No  Pt smoker: former smoker, quit about 2000   Past Medical History  Diagnosis Date  . AAA (abdominal aortic aneurysm)   . Hyperlipidemia   . Chronic low back pain   . Arthritis Aug. 2011    Degenerative disk disease- low back  . 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 Joint replacement  1968 and 1969    Knee   . Tooth extraction  Aug. 2014   Social History History   Social History  . Marital Status: Married    Spouse Name: N/A  . Number of Children: N/A  . Years of Education: N/A   Occupational History  . Not on file.   Social History Main Topics  . Smoking status: Former Smoker    Types: Cigarettes    Quit date: 03/27/1999  . Smokeless tobacco: Never Used  . Alcohol Use: 0.0 oz/week    2-3 Glasses of wine per week  . Drug Use: No  . Sexual Activity: Not on file   Other Topics Concern  . Not on file   Social History Narrative  . No narrative on file   Family History Family History  Problem Relation Age of Onset  . Cancer Mother     Ovarian  . Heart disease Father     Current Outpatient Prescriptions on File  Prior to Visit  Medication Sig Dispense Refill  . aspirin 81 MG tablet Take 81 mg by mouth daily.    . cholecalciferol (VITAMIN D) 1000 UNITS tablet Take 1,000 Units by mouth daily.    . Garlic (GARLIQUE PO) Take by mouth.    Marland Kitchen glucosamine-chondroitin 500-400 MG tablet Take 1 tablet by mouth 3 (three) times daily.    Marland Kitchen ibuprofen (ADVIL,MOTRIN) 100 MG tablet Take 100 mg by mouth every 6 (six) hours as needed.    . Multiple Vitamin (MULTI-VITAMIN DAILY PO) Take by mouth.    . Omega-3 Fatty Acids (FISH OIL) 1200 MG CAPS Take by mouth.    . simvastatin (ZOCOR) 20 MG tablet Take 20 mg by mouth every other day.     No current facility-administered medications on file prior to visit.   Not on File  ROS: See HPI for pertinent positives and negatives.  Physical Examination  Filed Vitals:   05/26/14 0932  BP: 150/90  Pulse: 62  Resp: 16  Height: 5\' 5"  (1.651 m)  Weight: 161 lb (73.029 kg)  SpO2: 97%   Body mass index is 26.79 kg/(m^2).   General: A&O x 3, WD.  Pulmonary: Sym exp, good air movt, CTAB, no rales, rhonchi, or wheezing.  Cardiac: RRR, Nl S1, S2, no detected murmur.   Carotid Bruits  Left  Right    Negative  Negative   Aorta is palpable  Radial pulses are are 2+ palpable and =.   VASCULAR EXAM:  LE Pulses  LEFT  RIGHT   FEMORAL  palpable  palpable   POPLITEAL  1+ palpable  2+palpable   POSTERIOR TIBIAL  2+ palpable  2+palpable   DORSALIS PEDIS  ANTERIOR TIBIAL  2+palpable  2+palpable    Gastrointestinal: soft, NTND, -G/R, - HSM, - palpable masses, - CVAT B.  Musculoskeletal: M/S 5/5 throughout, Extremities without ischemic changes.  Neurologic: CN 2-12 intact, Pain and light touch intact in extremities are intact except, Motor exam as listed above.         Non-Invasive Vascular Imaging  AAA Duplex (05/26/2014) ABDOMINAL AORTA DUPLEX EVALUATION    INDICATION: Abdominal aortic aneurysm    PREVIOUS INTERVENTION(S):     DUPLEX  EXAM:     LOCATION DIAMETER AP (cm) DIAMETER TRANSVERSE (cm) VELOCITIES (cm/sec)  Aorta Proximal 2.9 2.8 55  Aorta Mid 2.2 2.2 72  Aorta Distal 4.8 4.9 39  Right Common Iliac Artery 1.3 1.2 86  Left Common Iliac Artery 1.5 1.4 113    Previous max aortic diameter:  4.22cm Date: 12/02/13     ADDITIONAL FINDINGS: . Decreased visualization of the abdominal vasculature due to overlying bowel gas. . Mural thrombus noted in the distal abdominal aorta with no hemodynamically significant stenosis of the abdominal aorta and bilateral proximal common iliac arteries.    IMPRESSION: Aneurysmal dilatation of the distal abdominal aorta with a maximum diameter of 4.9cm.    Compared to the previous exam:  Increase in the maximum diameter of the abdominal aortic aneurysm noted when compared to the previous exam.      Medical Decision Making  The patient is a 74 y.o. male who presents with asymptomatic AAA with an increase in size with decreased visualization of the abdominal vasculature due to overlying bowel gas as noted above. Largest measurement today is 4.9 cm compared to 4.2 cm six months prior.    Based on this patient's exam and diagnostic studies, and after discussing with Dr. Scot Dock, the patient will follow up in 6 months  with the following studies: CTA abd/pelvis and see me.  Consideration for repair of AAA would be made when the size is 5.5 cm, growth > 1 cm/yr, and symptomatic status.  I emphasized the importance of maximal medical management including strict control of blood pressure, blood glucose, and lipid levels, antiplatelet agents, obtaining regular exercise, and continued cessation of smoking.   The patient was given information about AAA including signs, symptoms, treatment, and how to minimize the risk of enlargement and rupture of aneurysms.    The patient was advised to call 911 should the patient experience sudden onset abdominal or back pain.   Thank you for allowing [place]  to participate in this patient's care.  Clemon Chambers, RN, MSN, FNP-C [facility] [place] Office: [phone]  Clinic Physician: Scot Dock  05/26/2014, 9:27 AM

## 2014-05-26 NOTE — Patient Instructions (Signed)
Abdominal Aortic Aneurysm An aneurysm is a weakened or damaged part of an artery wall that bulges from the normal force of blood pumping through the body. An abdominal aortic aneurysm is an aneurysm that occurs in the lower part of the aorta, the main artery of the body.  The major concern with an abdominal aortic aneurysm is that it can enlarge and burst (rupture) or blood can flow between the layers of the wall of the aorta through a tear (aorticdissection). Both of these conditions can cause bleeding inside the body and can be life threatening unless diagnosed and treated promptly. CAUSES  The exact cause of an abdominal aortic aneurysm is unknown. Some contributing factors are:   A hardening of the arteries caused by the buildup of fat and other substances in the lining of a blood vessel (arteriosclerosis).  Inflammation of the walls of an artery (arteritis).   Connective tissue diseases, such as Marfan syndrome.   Abdominal trauma.   An infection, such as syphilis or staphylococcus, in the wall of the aorta (infectious aortitis) caused by bacteria. RISK FACTORS  Risk factors that contribute to an abdominal aortic aneurysm may include:  Age older than 60 years.   High blood pressure (hypertension).  Male gender.  Ethnicity (white race).  Obesity.  Family history of aneurysm (first degree relatives only).  Tobacco use. PREVENTION  The following healthy lifestyle habits may help decrease your risk of abdominal aortic aneurysm:  Quitting smoking. Smoking can raise your blood pressure and cause arteriosclerosis.  Limiting or avoiding alcohol.  Keeping your blood pressure, blood sugar level, and cholesterol levels within normal limits.  Decreasing your salt intake. In somepeople, too much salt can raise blood pressure and increase your risk of abdominal aortic aneurysm.  Eating a diet low in saturated fats and cholesterol.  Increasing your fiber intake by including  whole grains, vegetables, and fruits in your diet. Eating these foods may help lower blood pressure.  Maintaining a healthy weight.  Staying physically active and exercising regularly. SYMPTOMS  The symptoms of abdominal aortic aneurysm may vary depending on the size and rate of growth of the aneurysm.Most grow slowly and do not have any symptoms. When symptoms do occur, they may include:  Pain (abdomen, side, lower back, or groin). The pain may vary in intensity. A sudden onset of severe pain may indicate that the aneurysm has ruptured.  Feeling full after eating only small amounts of food.  Nausea or vomiting or both.  Feeling a pulsating lump in the abdomen.  Feeling faint or passing out. DIAGNOSIS  Since most unruptured abdominal aortic aneurysms have no symptoms, they are often discovered during diagnostic exams for other conditions. An aneurysm may be found during the following procedures:  Ultrasonography (A one-time screening for abdominal aortic aneurysm by ultrasonography is also recommended for all men aged 65-75 years who have ever smoked).  X-ray exams.  A computed tomography (CT).  Magnetic resonance imaging (MRI).  Angiography or arteriography. TREATMENT  Treatment of an abdominal aortic aneurysm depends on the size of your aneurysm, your age, and risk factors for rupture. Medication to control blood pressure and pain may be used to manage aneurysms smaller than 6 cm. Regular monitoring for enlargement may be recommended by your caregiver if:  The aneurysm is 3-4 cm in size (an annual ultrasonography may be recommended).  The aneurysm is 4-4.5 cm in size (an ultrasonography every 6 months may be recommended).  The aneurysm is larger than 4.5 cm in   size (your caregiver may ask that you be examined by a vascular surgeon). If your aneurysm is larger than 6 cm, surgical repair may be recommended. There are two main methods for repair of an aneurysm:   Endovascular  repair (a minimally invasive surgery). This is done most often.  Open repair. This method is used if an endovascular repair is not possible. Document Released: 12/20/2004 Document Revised: 07/07/2012 Document Reviewed: 04/11/2012 Exit[employer] Patient Information 2015 [employer]. This information is not intended to replace advice given to you by your health care provider. Make sure you discuss any questions you have with your health care provider.  

## 2014-05-26 NOTE — Addendum Note (Signed)
Addended by: Peter Minium K on: 05/26/2014 11:00 AM   Modules accepted: Orders

## 2014-06-02 ENCOUNTER — Other Ambulatory Visit (HOSPITAL_COMMUNITY): Payer: Medicare Other

## 2014-06-02 ENCOUNTER — Ambulatory Visit: Payer: Medicare Other | Admitting: Family

## 2014-06-08 ENCOUNTER — Other Ambulatory Visit (HOSPITAL_COMMUNITY): Payer: Medicare Other

## 2014-06-08 ENCOUNTER — Ambulatory Visit: Payer: Medicare Other | Admitting: Family

## 2014-06-30 ENCOUNTER — Ambulatory Visit: Payer: Medicare Other | Admitting: Family

## 2014-06-30 ENCOUNTER — Other Ambulatory Visit (HOSPITAL_COMMUNITY): Payer: Medicare Other

## 2014-09-20 ENCOUNTER — Other Ambulatory Visit: Payer: Self-pay

## 2014-11-22 ENCOUNTER — Other Ambulatory Visit: Payer: Self-pay | Admitting: Vascular Surgery

## 2014-11-22 LAB — CREATININE, SERUM: CREATININE: 0.93 mg/dL (ref 0.70–1.18)

## 2014-11-22 LAB — BUN: BUN: 20 mg/dL (ref 7–25)

## 2014-11-24 ENCOUNTER — Ambulatory Visit
Admission: RE | Admit: 2014-11-24 | Discharge: 2014-11-24 | Disposition: A | Discharge status: Home / Self Care | Payer: Medicare Other | Source: Ambulatory Visit | Attending: Family | Admitting: Family

## 2014-11-24 DIAGNOSIS — Z87891 Personal history of nicotine dependence: Secondary | ICD-10-CM

## 2014-11-24 DIAGNOSIS — I714 Abdominal aortic aneurysm, without rupture, unspecified: Secondary | ICD-10-CM

## 2014-11-24 MED ORDER — IOPAMIDOL (ISOVUE-370) INJECTION 76%
75.0000 mL | Freq: Once | INTRAVENOUS | Status: AC | PRN
  Administered 2014-11-24: 75 mL via INTRAVENOUS

## 2014-11-30 ENCOUNTER — Encounter: Payer: Self-pay | Admitting: Family

## 2014-12-01 ENCOUNTER — Ambulatory Visit (INDEPENDENT_AMBULATORY_CARE_PROVIDER_SITE_OTHER): Payer: Medicare Other | Admitting: Family

## 2014-12-01 ENCOUNTER — Encounter: Payer: Self-pay | Admitting: Family

## 2014-12-01 VITALS — BP 128/75 | HR 64 | Temp 97.6°F | Ht 65.0 in | Wt 141.9 lb

## 2014-12-01 DIAGNOSIS — I714 Abdominal aortic aneurysm, without rupture, unspecified: Secondary | ICD-10-CM

## 2014-12-01 DIAGNOSIS — Z87891 Personal history of nicotine dependence: Secondary | ICD-10-CM | POA: Diagnosis not present

## 2014-12-01 NOTE — Progress Notes (Signed)
[facility]   Established Abdominal Aortic Aneurysm  History of Present Illness  [name] [name] is a 74 y.o. (04/06/1940) male patient of Dr. Scot [name] who presents with chief complaint: follow up for AAA surveillance; this was found incidentally on a Lifescan years ago.  A 2015 AAA duplex showed a maximum diameter of 4.5 by 4.8 cm which is no increase in size from 6 months prior. The patient does not have back or abdominal pain. The patient is a former smoker.    The patient denies claudication in legs with walking.  The patient denies history of stroke or TIA symptoms.  He reports new medical or surgical history: none.  He denies family history of aneurysms.  Pt lost 20 pounds in 6 months, he states this is intentional, he is exercising twice daily, walks 35-40 minutes daily.  [name] [name] is his PCP, his next scheduled appointment with her is December 2016. Pt Diabetic: No  Pt smoker: former smoker, quit about 2001   Past Medical History  Diagnosis Date  . AAA (abdominal aortic aneurysm)   . Hyperlipidemia   . Chronic low back pain   . Arthritis Aug. 2011    Degenerative disk disease- low back  . 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, [facility]   Past Surgical History  Procedure Laterality Date  . Joint replacement  1968 and 1969    Knee   . Tooth extraction  Aug. 2014   Social History Social History   Social History  . Marital Status: Married    Spouse Name: N/A  . Number of Children: N/A  . Years of Education: N/A   Occupational History  . Not on file.   Social History Main Topics  . Smoking status: Former Smoker    Types: Cigarettes    Quit date: 03/27/1999  . Smokeless tobacco: Never Used  . Alcohol Use: 0.0 oz/week    2-3 Glasses of wine per week  . Drug Use: No  . Sexual Activity: Not on file   Other Topics Concern  . Not on file   Social History Narrative   Family History Family History  Problem Relation Age of Onset  .  Cancer Mother     Ovarian  . Heart disease Father     Current Outpatient Prescriptions on File Prior to Visit  Medication Sig Dispense Refill  . aspirin 81 MG tablet Take 81 mg by mouth daily.    . cholecalciferol (VITAMIN D) 1000 UNITS tablet Take 1,000 Units by mouth daily.    Marland Kitchen doxycycline (VIBRA-TABS) 100 MG tablet Take 100 mg by mouth 2 (two) times daily. Seven days only  0  . Garlic (GARLIQUE PO) Take by mouth.    Marland Kitchen glucosamine-chondroitin 500-400 MG tablet Take 1 tablet by mouth 3 (three) times daily.    Marland Kitchen ibuprofen (ADVIL,MOTRIN) 100 MG tablet Take 100 mg by mouth every 6 (six) hours as needed.    . Multiple Vitamin (MULTI-VITAMIN DAILY PO) Take by mouth.    . Omega-3 Fatty Acids (FISH OIL) 1200 MG CAPS Take by mouth.    . simvastatin (ZOCOR) 20 MG tablet Take 20 mg by mouth every other day.     No current facility-administered medications on file prior to visit.   Not on File  ROS: See HPI for pertinent positives and negatives.  Physical Examination  Filed Vitals:   12/01/14 0851  BP: 128/75  Pulse: 64  Temp: 97.6 F (36.4 C)  TempSrc: Oral  Height:  5\' 5"  (1.651 m)  Weight: 141 lb 14.4 oz (64.365 kg)  SpO2: 99%   Body mass index is 23.61 kg/(m^2).   General: A&O x 3, WD.  Pulmonary: Sym exp, good air movt, CTAB, no rales, rhonchi, or wheezing.  Cardiac: RRR, Nl S1, S2, no detected murmur.   Carotid Bruits  Left  Right    Negative  Negative   Aorta is palpable  Radial pulses are are 2+ palpable and =.   VASCULAR EXAM:  LE Pulses  LEFT  RIGHT   FEMORAL  palpable  palpable   POPLITEAL  1+ palpable  2+palpable   POSTERIOR TIBIAL  2+ palpable  2+palpable   DORSALIS PEDIS  ANTERIOR TIBIAL  2+palpable  2+palpable    Gastrointestinal: soft, NTND, -G/R, - HSM, - palpable masses, - CVAT B.  Musculoskeletal: M/S 5/5 throughout, Extremities without ischemic changes.  Neurologic: CN 2-12 intact, Pain and light  touch intact in extremities are intact except, Motor exam as listed above.              CTA abd/pelvis 11/24/14:  IMPRESSION: Vascular Impression:  1. Large infrarenal abdominal aortic aneurysm measuring approximately 5.3 cm in diameter. There is a moderate to large amount of eccentric noncalcified mural thrombus within the dominant component of the infrarenal abdominal aortic aneurysm without evidence of abdominal aortic dissection or periaortic stranding. Nonvascular Impression:  1. No definite explanation for patient's unintentional weight loss. 2. Scattered pulmonary nodules within the incidentally imaged bilateral lung bases with dominant nodule with the left costophrenic angle measuring approximately 5 mm in diameter. Comparison with prior outside examinations (if available) is recommended. Otherwise if the patient is at high risk for bronchogenic carcinoma, follow-up chest CT at 6-12 months is recommended. If the patient is at low risk for bronchogenic carcinoma, follow-up chest CT at 12 months is recommended. This recommendation follows the consensus statement: Guidelines for Management of Small Pulmonary Nodules Detected on CT Scans: A Statement from the [employer] as published in Radiology 2005;237:395-400. 3. 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    Medical Decision Making  The patient is a 74 y.o. male who presents with asymptomatic AAA with increase in size from 4.9 cm by ultrasound on 05/26/14 to 5.4 cm by Dr. Nicole Cella measurement on viewing the images from 11/24/14 CTA abdomen/pelvis. SMA and celiac arteries with no hemodynamically significant stenoses. Pt states his weight loss thus far has been intentional with intensive daily exercise.  EPIC message sent to Dr. Mayra Neer which includes the radiologist report of CTA abd/pelvis performed 11/24/14, and to note radiologist incidental findings of  nodules in lung bases which require follow up. Pt advised to call Dr. Raul Del office as soon as possible for her to advise him whether he needs to be evaluated sooner than December 2016 re the incidental lung findings on CTA. Face to face time with patient was 25 minutes. Over 50% of this time was spent on counseling and coordination of care.  Dr. Scot [name] spoke with pt.   Based on this patient's exam and diagnostic studies, the patient will follow up in 6 months  with the following studies: AAA Duplex and see Dr. Scot [name].  Consideration for repair of AAA would be made when the size is 5.5 cm, growth > 1 cm/yr, and symptomatic status.  I emphasized the importance of maximal medical management including strict control of blood pressure, blood glucose, and lipid levels, antiplatelet agents, obtaining regular exercise,  and continued cessation of smoking.   The patient was given information about AAA including signs, symptoms, treatment, and how to minimize the risk of enlargement and rupture of aneurysms.    The patient was advised to call 911 should the patient experience sudden onset abdominal or back pain.   Thank you for allowing [place] to participate in this patient's care.  Clemon Chambers, RN, MSN, FNP-C [facility] [place] Office: [phone]  Clinic Physician: [name] [name]  12/01/2014, 8:51 AM

## 2014-12-01 NOTE — Addendum Note (Signed)
Addended by: Dorthula Rue L on: 12/01/2014 04:19 PM   Modules accepted: Orders

## 2014-12-01 NOTE — Patient Instructions (Signed)
Abdominal Aortic Aneurysm An aneurysm is a weakened or damaged part of an artery wall that bulges from the normal force of blood pumping through the body. An abdominal aortic aneurysm is an aneurysm that occurs in the lower part of the aorta, the main artery of the body.  The major concern with an abdominal aortic aneurysm is that it can enlarge and burst (rupture) or blood can flow between the layers of the wall of the aorta through a tear (aorticdissection). Both of these conditions can cause bleeding inside the body and can be life threatening unless diagnosed and treated promptly. CAUSES  The exact cause of an abdominal aortic aneurysm is unknown. Some contributing factors are:   A hardening of the arteries caused by the buildup of fat and other substances in the lining of a blood vessel (arteriosclerosis).  Inflammation of the walls of an artery (arteritis).   Connective tissue diseases, such as Marfan syndrome.   Abdominal trauma.   An infection, such as syphilis or staphylococcus, in the wall of the aorta (infectious aortitis) caused by bacteria. RISK FACTORS  Risk factors that contribute to an abdominal aortic aneurysm may include:  Age older than 60 years.   High blood pressure (hypertension).  Male gender.  Ethnicity (white race).  Obesity.  Family history of aneurysm (first degree relatives only).  Tobacco use. PREVENTION  The following healthy lifestyle habits may help decrease your risk of abdominal aortic aneurysm:  Quitting smoking. Smoking can raise your blood pressure and cause arteriosclerosis.  Limiting or avoiding alcohol.  Keeping your blood pressure, blood sugar level, and cholesterol levels within normal limits.  Decreasing your salt intake. In somepeople, too much salt can raise blood pressure and increase your risk of abdominal aortic aneurysm.  Eating a diet low in saturated fats and cholesterol.  Increasing your fiber intake by including  whole grains, vegetables, and fruits in your diet. Eating these foods may help lower blood pressure.  Maintaining a healthy weight.  Staying physically active and exercising regularly. SYMPTOMS  The symptoms of abdominal aortic aneurysm may vary depending on the size and rate of growth of the aneurysm.Most grow slowly and do not have any symptoms. When symptoms do occur, they may include:  Pain (abdomen, side, lower back, or groin). The pain may vary in intensity. A sudden onset of severe pain may indicate that the aneurysm has ruptured.  Feeling full after eating only small amounts of food.  Nausea or vomiting or both.  Feeling a pulsating lump in the abdomen.  Feeling faint or passing out. DIAGNOSIS  Since most unruptured abdominal aortic aneurysms have no symptoms, they are often discovered during diagnostic exams for other conditions. An aneurysm may be found during the following procedures:  Ultrasonography (A one-time screening for abdominal aortic aneurysm by ultrasonography is also recommended for all men aged 65-75 years who have ever smoked).  X-ray exams.  A computed tomography (CT).  Magnetic resonance imaging (MRI).  Angiography or arteriography. TREATMENT  Treatment of an abdominal aortic aneurysm depends on the size of your aneurysm, your age, and risk factors for rupture. Medication to control blood pressure and pain may be used to manage aneurysms smaller than 6 cm. Regular monitoring for enlargement may be recommended by your caregiver if:  The aneurysm is 3-4 cm in size (an annual ultrasonography may be recommended).  The aneurysm is 4-4.5 cm in size (an ultrasonography every 6 months may be recommended).  The aneurysm is larger than 4.5 cm in   size (your caregiver may ask that you be examined by a vascular surgeon). If your aneurysm is larger than 6 cm, surgical repair may be recommended. There are two main methods for repair of an aneurysm:   Endovascular  repair (a minimally invasive surgery). This is done most often.  Open repair. This method is used if an endovascular repair is not possible. Document Released: 12/20/2004 Document Revised: 07/07/2012 Document Reviewed: 04/11/2012 Exit[employer] Patient Information 2015 [employer]. This information is not intended to replace advice given to you by your health care provider. Make sure you discuss any questions you have with your health care provider.  

## 2015-02-21 ENCOUNTER — Other Ambulatory Visit: Payer: Self-pay | Admitting: Family Medicine

## 2015-02-21 DIAGNOSIS — R911 Solitary pulmonary nodule: Secondary | ICD-10-CM

## 2015-03-02 ENCOUNTER — Ambulatory Visit
Admission: RE | Admit: 2015-03-02 | Discharge: 2015-03-02 | Disposition: A | Discharge status: Home / Self Care | Payer: Medicare Other | Source: Ambulatory Visit | Attending: Family Medicine | Admitting: Family Medicine

## 2015-03-02 DIAGNOSIS — R911 Solitary pulmonary nodule: Secondary | ICD-10-CM

## 2015-03-02 MED ORDER — IOPAMIDOL (ISOVUE-300) INJECTION 61%
75.0000 mL | Freq: Once | INTRAVENOUS | Status: AC | PRN
  Administered 2015-03-02: 75 mL via INTRAVENOUS

## 2015-06-01 ENCOUNTER — Other Ambulatory Visit (HOSPITAL_COMMUNITY): Payer: Medicare Other

## 2015-06-01 ENCOUNTER — Ambulatory Visit: Payer: Medicare Other | Admitting: Vascular Surgery

## 2015-06-08 ENCOUNTER — Encounter: Payer: Self-pay | Admitting: Vascular Surgery

## 2015-06-15 ENCOUNTER — Ambulatory Visit (INDEPENDENT_AMBULATORY_CARE_PROVIDER_SITE_OTHER): Payer: Medicare Other | Admitting: Vascular Surgery

## 2015-06-15 ENCOUNTER — Ambulatory Visit (HOSPITAL_COMMUNITY)
Admission: RE | Admit: 2015-06-15 | Discharge: 2015-06-15 | Disposition: A | Discharge status: Home / Self Care | Payer: Medicare Other | Source: Ambulatory Visit | Attending: Vascular Surgery | Admitting: Vascular Surgery

## 2015-06-15 ENCOUNTER — Encounter: Payer: Self-pay | Admitting: Vascular Surgery

## 2015-06-15 VITALS — BP 128/78 | HR 65 | Ht 65.0 in | Wt 142.0 lb

## 2015-06-15 DIAGNOSIS — Z87891 Personal history of nicotine dependence: Secondary | ICD-10-CM | POA: Insufficient documentation

## 2015-06-15 DIAGNOSIS — E785 Hyperlipidemia, unspecified: Secondary | ICD-10-CM | POA: Diagnosis not present

## 2015-06-15 DIAGNOSIS — I714 Abdominal aortic aneurysm, without rupture, unspecified: Secondary | ICD-10-CM

## 2015-06-15 NOTE — Progress Notes (Signed)
[facility]  Patient name: Taria Wisecup MRN: [id number] DOB: Jul 28, 1940 Sex: male  REASON FOR VISIT: Follow up of abdominal aortic aneurysm  HPI: [name] [name] is a 75 y.o. male who was last seen in our office on 12/01/2014 by the nurse practitioner. The aneurysm measured 5.3 cm in maximum diameter. Patient comes in for a 6 month follow up visit. Since we saw him last, he denies any history of abdominal pain or back pain. He remains very active and is not a smoker. He has no specific complaints.  Past Medical History  Diagnosis Date  . AAA (abdominal aortic aneurysm) ([place])   . Hyperlipidemia   . Chronic low back pain   . Arthritis Aug. 2011    Degenerative disk disease- low back  . 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 Cancer Mother     Ovarian  . Heart disease Father     SOCIAL HISTORY: Social History  Substance Use Topics  . Smoking status: Former Smoker    Types: Cigarettes    Quit date: 03/27/1999  . Smokeless tobacco: Never Used  . Alcohol Use: 0.0 oz/week    2-3 Glasses of wine per week    No Known Allergies  Current Outpatient Prescriptions  Medication Sig Dispense Refill  . aspirin 81 MG tablet Take 81 mg by mouth daily.    . cholecalciferol (VITAMIN D) 1000 UNITS tablet Take 1,000 Units by mouth daily.    Marland Kitchen doxycycline (VIBRA-TABS) 100 MG tablet Take 100 mg by mouth 2 (two) times daily. Seven days only  0  . Garlic (GARLIQUE PO) Take by mouth.    Marland Kitchen glucosamine-chondroitin 500-400 MG tablet Take 1 tablet by mouth 3 (three) times daily.    Marland Kitchen ibuprofen (ADVIL,MOTRIN) 100 MG tablet Take 100 mg by mouth every 6 (six) hours as needed.    . Multiple Vitamin (MULTI-VITAMIN DAILY PO) Take by mouth.    . simvastatin (ZOCOR) 20 MG tablet Take 20 mg by mouth every other day.    . Omega-3 Fatty Acids (FISH OIL) 1200 MG CAPS Take by mouth. Reported on 06/15/2015     No current  facility-administered medications for this visit.    REVIEW OF SYSTEMS:  [X]  denotes positive finding, [ ]  denotes negative finding Cardiac  Comments:  Chest pain or chest pressure:    Shortness of breath upon exertion:    Short of breath when lying flat:    Irregular heart rhythm:        Vascular    Pain in calf, thigh, or hip brought on by ambulation:    Pain in feet at night that wakes you up from your sleep:     Blood clot in your veins:    Leg swelling:         Pulmonary    Oxygen at home:    Productive cough:     Wheezing:         Neurologic    Sudden weakness in arms or legs:     Sudden numbness in arms or legs:     Sudden onset of difficulty speaking or slurred speech:    Temporary loss of vision in one eye:     Problems with dizziness:         Gastrointestinal    Blood in stool:     Vomited blood:  Genitourinary    Burning when urinating:     Blood in urine:        Psychiatric    Major depression:         Hematologic    Bleeding problems:    Problems with blood clotting too easily:        Skin    Rashes or ulcers:        Constitutional    Fever or chills:      PHYSICAL EXAM: Filed Vitals:   06/15/15 0944  BP: 128/78  Pulse: 65  Height: 5\' 5"  (1.651 m)  Weight: 142 lb (64.411 kg)  SpO2: 98%    GENERAL: The patient is a well-nourished male, in no acute distress. The vital signs are documented above. CARDIAC: There is a regular rate and rhythm.  VASCULAR: I do not detect carotid bruits. He has palpable femoral and pedal pulses bilaterally. PULMONARY: There is good air exchange bilaterally without wheezing or rales. ABDOMEN: Soft and non-tender with normal pitched bowel sounds. His aneurysm is easily palpable and nontender. MUSCULOSKELETAL: There are no major deformities or cyanosis. NEUROLOGIC: No focal weakness or paresthesias are detected. SKIN: There are no ulcers or rashes noted. PSYCHIATRIC: The patient has a normal  affect.  DATA:   DUPLEX OF ABDOMINAL AORTA: Maxim diameter of the aneurysm is 5.3 cm which is not changed compared to the study 6 months ago. The right common iliac artery measures 1.5 cm in maximum diameter. The left common iliac artery measures 1.5 cm in maximum diameter.  MEDICAL ISSUES:  ABDOMINAL AORTIC ANEURYSM: His aneurysm remains stable at size and is not changed in the last 6 months. It measures 5.3 cm in maximum diameter. He is asymptomatic. He understands we would not consider elective repair and normal respiration less the aneurysm reached 5.5 cm in maximum diameter. I ordered a follow up ultrasound in 6 months LC number that time. His blood pressure is under good control. He is not a smoker. He is on aspirin and is on a statin.  Of note, based on his previous CAT scan in August 2016, it appears that he would be a candidate for an endovascular repair of his aneurysm.   No Follow-up on file.   Deitra Mayo [facility] [place]: [phone]

## 2015-06-16 ENCOUNTER — Other Ambulatory Visit: Payer: Self-pay

## 2015-06-16 NOTE — Addendum Note (Signed)
Addended by: Thresa Ross C on: 06/16/2015 11:52 AM   Modules accepted: Orders

## 2015-12-08 ENCOUNTER — Encounter: Payer: Self-pay | Admitting: Vascular Surgery

## 2015-12-14 ENCOUNTER — Ambulatory Visit (HOSPITAL_COMMUNITY)
Admission: RE | Admit: 2015-12-14 | Discharge: 2015-12-14 | Disposition: A | Discharge status: Home / Self Care | Payer: Medicare Other | Source: Ambulatory Visit | Attending: Vascular Surgery | Admitting: Vascular Surgery

## 2015-12-14 ENCOUNTER — Ambulatory Visit (INDEPENDENT_AMBULATORY_CARE_PROVIDER_SITE_OTHER): Payer: Medicare Other | Admitting: Vascular Surgery

## 2015-12-14 ENCOUNTER — Encounter: Payer: Self-pay | Admitting: Vascular Surgery

## 2015-12-14 VITALS — BP 129/82 | HR 62 | Ht 65.0 in | Wt 149.6 lb

## 2015-12-14 DIAGNOSIS — I714 Abdominal aortic aneurysm, without rupture, unspecified: Secondary | ICD-10-CM

## 2015-12-14 NOTE — Addendum Note (Signed)
Addended by: Kaleen Mask on: 12/14/2015 02:14 PM   Modules accepted: Orders

## 2015-12-14 NOTE — Progress Notes (Signed)
Patient name: [name] [name] MRN: [id number] DOB: 04/17/1940 Sex: male  REASON FOR VISIT: Follow up of abdominal aortic aneurysm.  HPI: [name] [name] is a 75 y.o. male who I have been following with an abdominal aortic aneurysm. I last saw him on 06/15/2015. At the time of his last visit, the maximum diameter of the aneurysm was 5.3 cm. This had not changed over 6 months. The right common iliac artery measured 1.5 cm in the left common iliac artery measured 1.5 cm also. He comes in for his 6 month follow up visit. He did have a CT scan in August 2016 and based on that it appeared that he might be a candidate for endovascular repair of his aneurysm.  He denies any abdominal pain or back pain. He remains very active. He walks every day and works out in [employer]. He is helping his wife recover from hip surgery recently.  He has no history of previous myocardial infarction or history of congestive heart failure.  Past Medical History:  Diagnosis Date  . AAA (abdominal aortic aneurysm) ([place])   . Arthritis Aug. 2011   Degenerative disk disease- low back  . 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 Chronic low back pain   . Hyperlipidemia     Family History  Problem Relation Age of Onset  . Cancer Mother     Ovarian  . Heart disease Father     SOCIAL HISTORY: Social History  Substance Use Topics  . Smoking status: Former Smoker    Types: Cigarettes    Quit date: 03/27/1999  . Smokeless tobacco: Never Used  . Alcohol use 0.0 oz/week    2 - 3 Glasses of wine per week    No Known Allergies  Current Outpatient Prescriptions  Medication Sig Dispense Refill  . cholecalciferol (VITAMIN D) 1000 UNITS tablet Take 1,000 Units by mouth daily.    Marland Kitchen doxycycline (VIBRA-TABS) 100 MG tablet Take 100 mg by mouth 2 (two) times daily. Seven days only  0  . Garlic (GARLIQUE PO) Take by mouth.    Marland Kitchen glucosamine-chondroitin 500-400 MG tablet Take 1 tablet by mouth 3 (three) times daily.    Marland Kitchen ibuprofen  (ADVIL,MOTRIN) 100 MG tablet Take 100 mg by mouth every 6 (six) hours as needed.    . Multiple Vitamin (MULTI-VITAMIN DAILY PO) Take by mouth.    . simvastatin (ZOCOR) 20 MG tablet Take 20 mg by mouth every other day.    Marland Kitchen aspirin 81 MG tablet Take 81 mg by mouth daily.    . Omega-3 Fatty Acids (FISH OIL) 1200 MG CAPS Take by mouth. Reported on 06/15/2015     No current facility-administered medications for this visit.     REVIEW OF SYSTEMS:  [X]  denotes positive finding, [ ]  denotes negative finding Cardiac  Comments:  Chest pain or chest pressure:    Shortness of breath upon exertion:    Short of breath when lying flat:    Irregular heart rhythm:        Vascular    Pain in calf, thigh, or hip brought on by ambulation:    Pain in feet at night that wakes you up from your sleep:     Blood clot in your veins:    Leg swelling:         Pulmonary    Oxygen at home:    Productive cough:     Wheezing:         Neurologic  Sudden weakness in arms or legs:     Sudden numbness in arms or legs:     Sudden onset of difficulty speaking or slurred speech:    Temporary loss of vision in one eye:     Problems with dizziness:         Gastrointestinal    Blood in stool:     Vomited blood:         Genitourinary    Burning when urinating:     Blood in urine:        Psychiatric    Major depression:         Hematologic    Bleeding problems:    Problems with blood clotting too easily:        Skin    Rashes or ulcers:        Constitutional    Fever or chills:      PHYSICAL EXAM: Vitals:   12/14/15 0937  BP: 129/82  Pulse: 62  SpO2: 98%  Weight: 149 lb 9.6 oz (67.9 kg)  Height: 5\' 5"  (1.651 m)    GENERAL: The patient is a well-nourished male, in no acute distress. The vital signs are documented above. CARDIAC: There is a regular rate and rhythm.  VASCULAR: I do not detect carotid bruits. He has palpable femoral, popliteal, dorsalis pedis, and posterior tibial pulses  bilaterally. He has no significant lower extremity swelling. PULMONARY: There is good air exchange bilaterally without wheezing or rales. ABDOMEN: Soft and non-tender with normal pitched bowel sounds.  MUSCULOSKELETAL: There are no major deformities or cyanosis. NEUROLOGIC: No focal weakness or paresthesias are detected. SKIN: There are no ulcers or rashes noted. PSYCHIATRIC: The patient has a normal affect.  DATA:   DUPLEX ABDOMINAL AORTA: I have tentatively interpreted the duplex of his abdominal aorta. The maximum diameter of the aneurysm is 5.55 cm. This has increased in size from 5.3 cm in March 2017.  MEDICAL ISSUES:  5.5 CM INFRARENAL ABDOMINAL AORTIC ANEURYSM: Given that the aneurysm is now greater than 5.5 cm in maximum diameter, I have recommended elective repair. Based on his previous CT scan over a year ago it appears that he might be a candidate for endovascular repair. I have ordered a CT angiogram and also preoperative cardiac clearance. Once his workup is complete we can schedule him hopefully for endovascular repair of his aneurysm. We have discussed the indications for repair and the potential complications including but not limited to graft migration, the need for continued follow up, an endoleak. We also discussed possibly having to convert to an open repair although this is unlikely.    Deitra Mayo [facility] [phone]

## 2015-12-21 ENCOUNTER — Encounter: Payer: Self-pay | Admitting: Vascular Surgery

## 2015-12-21 ENCOUNTER — Ambulatory Visit
Admission: RE | Admit: 2015-12-21 | Discharge: 2015-12-21 | Disposition: A | Discharge status: Home / Self Care | Payer: Medicare Other | Source: Ambulatory Visit | Attending: Vascular Surgery | Admitting: Vascular Surgery

## 2015-12-21 DIAGNOSIS — I714 Abdominal aortic aneurysm, without rupture, unspecified: Secondary | ICD-10-CM

## 2015-12-21 MED ORDER — IOPAMIDOL (ISOVUE-370) INJECTION 76%
100.0000 mL | Freq: Once | INTRAVENOUS | Status: AC | PRN
  Administered 2015-12-21: 100 mL via INTRAVENOUS

## 2015-12-22 ENCOUNTER — Encounter: Payer: Self-pay | Admitting: *Deleted

## 2015-12-28 ENCOUNTER — Ambulatory Visit (INDEPENDENT_AMBULATORY_CARE_PROVIDER_SITE_OTHER): Payer: Medicare Other | Admitting: Vascular Surgery

## 2015-12-28 ENCOUNTER — Encounter: Payer: Self-pay | Admitting: Vascular Surgery

## 2015-12-28 VITALS — BP 120/80 | HR 58 | Temp 97.5°F | Resp 16 | Ht 65.0 in | Wt 150.0 lb

## 2015-12-28 DIAGNOSIS — I714 Abdominal aortic aneurysm, without rupture, unspecified: Secondary | ICD-10-CM

## 2015-12-28 NOTE — Progress Notes (Signed)
Patient name: [name] [name] MRN: [id number] DOB: [birth year] Sex: male  REASON FOR VISIT: To discuss endovascular aneurysm repair.  HPI: [name] [name] is a 75 y.o. male who I have been following with an abdominal aortic aneurysm. When I last saw him in September, the aneurysm had enlarged to 5.6 cm by ultrasound. Therefore I thought we should consider elective repair given the tentative 10% per year  risk of rupture for an aneurysm of that size. He underwent a CT angiogram to evaluate him for endovascular repair and comes in to discuss those results.  He denies any abdominal pain or back pain. He is very active and works out quite a bit.  Past Medical History:  Diagnosis Date  . AAA (abdominal aortic aneurysm) ([place])   . Arthritis Aug. 2011   Degenerative disk disease- low back  . 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 Chronic low back pain   . Hyperlipidemia     Family History  Problem Relation Age of Onset  . Cancer Mother     Ovarian  . Heart disease Father     SOCIAL HISTORY: Social History  Substance Use Topics  . Smoking status: Former Smoker    Types: Cigarettes    Quit date: 03/27/1999  . Smokeless tobacco: Never Used  . Alcohol use 0.0 oz/week    2 - 3 Glasses of wine per week    Allergies  Allergen Reactions  . Bee Venom     Current Outpatient Prescriptions  Medication Sig Dispense Refill  . cholecalciferol (VITAMIN D) 1000 UNITS tablet Take 1,000 Units by mouth daily.    Marland Kitchen doxycycline (VIBRA-TABS) 100 MG tablet Take 100 mg by mouth 2 (two) times daily. Seven days only  0  . Garlic (GARLIQUE PO) Take by mouth.    Marland Kitchen glucosamine-chondroitin 500-400 MG tablet Take 1 tablet by mouth 3 (three) times daily.    Marland Kitchen ibuprofen (ADVIL,MOTRIN) 100 MG tablet Take 100 mg by mouth every 6 (six) hours as needed.    . Multiple Vitamin (MULTI-VITAMIN DAILY PO) Take by mouth.    . simvastatin (ZOCOR) 20 MG tablet Take 20 mg by mouth every other day.     No current  facility-administered medications for this visit.     REVIEW OF SYSTEMS:  [X]  denotes positive finding, [ ]  denotes negative finding Cardiac  Comments:  Chest pain or chest pressure:    Shortness of breath upon exertion:    Short of breath when lying flat:    Irregular heart rhythm:        Vascular    Pain in calf, thigh, or hip brought on by ambulation:    Pain in feet at night that wakes you up from your sleep:     Blood clot in your veins:    Leg swelling:         Pulmonary    Oxygen at home:    Productive cough:     Wheezing:         Neurologic    Sudden weakness in arms or legs:     Sudden numbness in arms or legs:     Sudden onset of difficulty speaking or slurred speech:    Temporary loss of vision in one eye:     Problems with dizziness:         Gastrointestinal    Blood in stool:     Vomited blood:         Genitourinary  Burning when urinating:     Blood in urine:        Psychiatric    Major depression:         Hematologic    Bleeding problems:    Problems with blood clotting too easily:        Skin    Rashes or ulcers:        Constitutional    Fever or chills:      PHYSICAL EXAM: Vitals:   12/28/15 0934  BP: 120/80  Pulse: (!) 58  Resp: 16  Temp: 97.5 F (36.4 C)  TempSrc: Oral  SpO2: 97%  Weight: 150 lb (68 kg)  Height: 5\' 5"  (1.651 m)    GENERAL: The patient is a well-nourished male, in no acute distress. The vital signs are documented above. CARDIAC: There is a regular rate and rhythm.  VASCULAR: I do not detect carotid bruits. He has palpable femoral, popliteal, dorsalis pedis, and posterior tibial pulses bilaterally. He has no significant lower extremity swelling. PULMONARY: There is good air exchange bilaterally without wheezing or rales. ABDOMEN: Soft and non-tender with normal pitched bowel sounds.  His aneurysm is palpable and nontender. MUSCULOSKELETAL: There are no major deformities or cyanosis. NEUROLOGIC: No focal  weakness or paresthesias are detected. SKIN: There are no ulcers or rashes noted. PSYCHIATRIC: The patient has a normal affect.  DATA:    CT ANGIOGRAM: I have reviewed his CT angiogram. The maximum diameter of the aneurysm is 5.8 cm. He does appear to be a candidate for endovascular repair.  MEDICAL ISSUES:    5.8 CM INFRARENAL ABDOMINAL AORTIC ANEURYSM: Given the size of his aneurysm I have recommended elective repair. He appears to be a good candidate for an endovascular repair. I have discussed the indications for aneurysm repair. I have explained that without repair, the risk of the aneurysm rupturing is 5-10% per year. We have discussed the advantages and disadvantages of open versus endovascular repair. The patient wishes to proceed with endovascular aneurysm repair (EVAR). I have discussed the potential complications of EVAR, including, but not limited to: bleeding, infection, arterial injury, graft migration, endoleak, renal failure, MI or other unpredictable medical problems. We have discussed the possibility of having to convert to open repair. We also discussed the need for continued lifelong follow-up after EVAR. All of the patients questions were answered and they are agreeable to proceed with surgery. His cardiac clearance is in progress and we will schedule surgery as soon as this is complete.     Deitra Mayo [facility] [phone]

## 2016-01-08 NOTE — Progress Notes (Signed)
New Outpatient Visit Date: 01/09/2016  Referring Provider: Angelia Mould, MD [address] [place], [place] [address]  Chief Complaint: Preop clearance for AAA repair  HPI:  [name] is a 75 y.o. year-old male with history of abdominal aortic aneurysm, hyperlipidemia, and degenerative disk disease, who has been referred by Dr. Scot Dock for preoperative cardiovascular evaluation before undergoing endovascular AAA repair.  He has a long history of infrarenal AAA, which has been monitored closely.  Most recent CT demonstrated a maximum diameter of 5.7 cm. The patient notes that his AAA was first discovered on a screening exam 12 years ago.  He does not have any abdominal or back pain.  He also denies chest pain, shortness of breath, palpitations, lightheadedness, edema, orthopnea, and PND.  He is very active, doing cardio and weight exercises several days per week.  He reports having knee surgery many years ago without any difficulties with anesthesia.  Mr. Bhatti reports borderline cholesterol levels on simvastatin 20 mg daily, which have been followed by his PCP, Dr. Brigitte Pulse.  --------------------------------------------------------------------------------------------------  Cardiovascular History & Procedures: Cardiovascular Problems:  AAA  Risk Factors:  PVD, hyperlipidemia, age, male gender  Cath/PCI:  None.  CV Surgery:  None.  EP Procedures and Devices:  None.  Non-Invasive Evaluation(s):  CTA abdomen/pelvis (12/21/15): Enlarging fusiform infrarenla AAA with maximal diameter of 5.7 cm.  Mild stenosis at the origin of the SMA.  Stable 5 mm left lower lobe subpleural nodule.  Abdominal aorta ultrasound (12/14/15): AAA measuring up to 5.6 cm in maximal diameter.  Recent CV Pertinent Labs: Lab Results  Component Value Date   K 3.8 10/26/2008   BUN 20 11/22/2014   CREATININE 0.93 11/22/2014     --------------------------------------------------------------------------------------------------  Past Medical History:  Diagnosis Date  . AAA (abdominal aortic aneurysm) ([place])   . Arthritis Aug. 2011   Degenerative disk disease- low back  . 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 Chronic low back pain   . Hyperlipidemia     Past Surgical History:  Procedure Laterality Date  . JOINT REPLACEMENT  1968 and 1969   Knee   . TOOTH EXTRACTION  Aug. 2014    Outpatient Encounter Prescriptions as of 01/09/2016  Medication Sig  . cholecalciferol (VITAMIN D) 1000 UNITS tablet Take 1,000 Units by mouth daily.  . Garlic (GARLIQUE PO) Take by mouth.  Marland Kitchen glucosamine-chondroitin 500-400 MG tablet Take 1 tablet by mouth 3 (three) times daily.  Marland Kitchen ibuprofen (ADVIL,MOTRIN) 100 MG tablet Take 100 mg by mouth every 6 (six) hours as needed.  . Multiple Vitamin (MULTI-VITAMIN DAILY PO) Take by mouth.  . simvastatin (ZOCOR) 20 MG tablet Take 20 mg by mouth every other day.  . [DISCONTINUED] doxycycline (VIBRA-TABS) 100 MG tablet Take 100 mg by mouth 2 (two) times daily. Seven days only   No facility-administered encounter medications on file as of 01/09/2016.     Allergies: Bee venom  Social History   Social History  . Marital status: Married    Spouse name: N/A  . Number of children: N/A  . Years of education: N/A   Occupational History  . Not on file.   Social History Main Topics  . Smoking status: Former Smoker    Packs/day: 2.50    Years: 25.00    Types: Cigarettes    Quit date: 03/27/1999  . Smokeless tobacco: Never Used  . Alcohol use 1.8 - 2.4 oz/week    2 - 3 Glasses of wine, 1 Shots of  liquor per week  . Drug use: No  . Sexual activity: Not on file   Other Topics Concern  . Not on file   Social History Narrative  . No narrative on file    Family History  Problem Relation Age of Onset  . Cancer Mother 58    Ovarian  . Heart disease Father 20    Endocarditis  .  Other Brother     plane crash    Review of Systems: A 12-system review of systems was performed and was negative except as noted in the HPI.  --------------------------------------------------------------------------------------------------  Physical Exam: BP 120/70 (BP Location: Right Arm, Patient Position: Sitting, Cuff Size: Normal)   Pulse 64   Ht 5\' 4"  (1.626 m)   Wt 149 lb 3.2 oz (67.7 kg)   SpO2 96%   BMI 25.61 kg/m   General:  Well-developed, well-nourished man seated comfortably in the exam room. HEENT: No conjunctival pallor or scleral icterus.  Moist mucous membranes.  OP clear. Neck: Supple without lymphadenopathy, thyromegaly, JVD, or HJR. Lungs: Normal work of breathing.  Clear to auscultation bilaterally without wheezes or crackles. Heart: Regular rate and rhythm without murmurs, rubs, or gallops.  Non-displaced PMI. Abd: Bowel sounds present.  Soft, NT/ND without hepatosplenomegaly Ext: No lower extremity edema.  Radial, PT, and DP pulses are 2+ bilaterally Skin: warm and dry without rash Neuro: CNIII-XII intact.  Strength and fine-touch sensation intact in upper and lower extremities bilaterally. Psych: Normal mood and affect.  I personally observed the patient to climb 2 flights of stairs without chest pain or shortness of breath.  EKG:  Sinus bradycradia with right axis deviation.  CT chest (03/01/16): I have personally reviewed the images.  Scattered bilateral pulmonary nodules noted. Coronary artery and aortic calcifications are present. There is also calcification noted along the aortic valve.  Lab Results  Component Value Date   WBC 13.8 (H) 10/11/2008   HGB 15.3 10/26/2008   HCT 45.0 10/26/2008   MCV 94.5 10/11/2008   PLT 260 10/11/2008    Lab Results  Component Value Date   NA 142 10/26/2008   K 3.8 10/26/2008   CL 100 10/11/2008   BUN 20 11/22/2014   CREATININE 0.93 11/22/2014   GLUCOSE 125 (H) 10/26/2008    No results found for: CHOL,  HDL, LDLCALC, LDLDIRECT, TRIG, CHOLHDL   --------------------------------------------------------------------------------------------------  ASSESSMENT AND PLAN: 1. Preop cardiovascular exam Patient presents for cardiovascular evaluation in anticipation of endovascular repair asymptomatic but enlarging infrarenal abdominal aortic aneurysm now measuring 5.7 cm. The patient is very active and does not have any symptoms to suggest unstable cardiac disease. His EKG today demonstrates sinus bradycardia and a right axis deviation but otherwise no significant abnormalities. Prior CT of the chest revealed coronary artery and aortic valve calcifications. Given his excellent functional status without symptoms, no further cardiac workup is warranted prior to proceeding with endovascular AAA repair. His risk for perioperative cardiovascular complication is 0000000 based on the ACS NSQIP calculator.  Given the patient's vascular disease, we discussed the potential benefits of escalating his statin to a high-intensity agent. He wishes to discuss this with Dr. Brigitte Pulse.  Follow-up: Return to clinic as needed  Nelva Bush, MD 01/09/2016  8:30 AM

## 2016-01-09 ENCOUNTER — Ambulatory Visit (INDEPENDENT_AMBULATORY_CARE_PROVIDER_SITE_OTHER): Payer: Medicare Other | Admitting: Internal Medicine

## 2016-01-09 ENCOUNTER — Encounter: Payer: Self-pay | Admitting: Internal Medicine

## 2016-01-09 ENCOUNTER — Other Ambulatory Visit: Payer: Self-pay

## 2016-01-09 VITALS — BP 120/70 | HR 64 | Ht 64.0 in | Wt 149.2 lb

## 2016-01-09 DIAGNOSIS — Z01810 Encounter for preprocedural cardiovascular examination: Secondary | ICD-10-CM | POA: Diagnosis not present

## 2016-01-09 DIAGNOSIS — I714 Abdominal aortic aneurysm, without rupture, unspecified: Secondary | ICD-10-CM

## 2016-01-09 DIAGNOSIS — Z7689 Persons encountering health services in other specified circumstances: Secondary | ICD-10-CM | POA: Diagnosis not present

## 2016-01-09 NOTE — Patient Instructions (Signed)
**Note De-identified [name] [name] Obfuscation** Medication Instructions:  Same-no changes  Labwork: None  Testing/Procedures: None  Follow-Up: As needed     If you need a refill on your cardiac medications before your next appointment, please call your pharmacy.   

## 2016-02-01 ENCOUNTER — Encounter (HOSPITAL_COMMUNITY)
Admission: RE | Admit: 2016-02-01 | Discharge: 2016-02-01 | Disposition: A | Discharge status: Home / Self Care | Payer: Medicare Other | Source: Ambulatory Visit | Attending: Vascular Surgery | Admitting: Vascular Surgery

## 2016-02-01 ENCOUNTER — Encounter (HOSPITAL_COMMUNITY): Payer: Self-pay

## 2016-02-01 DIAGNOSIS — Z01812 Encounter for preprocedural laboratory examination: Secondary | ICD-10-CM | POA: Diagnosis not present

## 2016-02-01 DIAGNOSIS — I714 Abdominal aortic aneurysm, without rupture: Secondary | ICD-10-CM | POA: Diagnosis not present

## 2016-02-01 DIAGNOSIS — Z01818 Encounter for other preprocedural examination: Secondary | ICD-10-CM | POA: Insufficient documentation

## 2016-02-01 HISTORY — DX: Personal history of urinary calculi: Z87.442

## 2016-02-01 HISTORY — DX: Headache, unspecified: R51.9

## 2016-02-01 HISTORY — DX: Headache: R51

## 2016-02-01 LAB — COMPREHENSIVE METABOLIC PANEL
ALT: 16 U/L — ABNORMAL LOW (ref 17–63)
ANION GAP: 10 (ref 5–15)
AST: 22 U/L (ref 15–41)
Albumin: 4.1 g/dL (ref 3.5–5.0)
Alkaline Phosphatase: 38 U/L (ref 38–126)
BUN: 18 mg/dL (ref 6–20)
CHLORIDE: 106 mmol/L (ref 101–111)
CO2: 23 mmol/L (ref 22–32)
Calcium: 9.4 mg/dL (ref 8.9–10.3)
Creatinine, Ser: 1 mg/dL (ref 0.61–1.24)
GFR calc Af Amer: >60 mL/min (ref >60)
Glucose, Bld: 110 mg/dL — ABNORMAL HIGH (ref 65–99)
POTASSIUM: 4.2 mmol/L (ref 3.5–5.1)
Sodium: 139 mmol/L (ref 135–145)
Total Bilirubin: 1.2 mg/dL (ref 0.3–1.2)
Total Protein: 7.1 g/dL (ref 6.5–8.1)

## 2016-02-01 LAB — CBC
HCT: 44.2 % (ref 39.0–52.0)
Hemoglobin: 15.1 g/dL (ref 13.0–17.0)
MCH: 31.9 pg (ref 26.0–34.0)
MCHC: 34.2 g/dL (ref 30.0–36.0)
MCV: 93.2 fL (ref 78.0–100.0)
PLATELETS: 262 10*3/uL (ref 150–400)
RBC: 4.74 MIL/uL (ref 4.22–5.81)
RDW: 13.6 % (ref 11.5–15.5)
WBC: 7.8 10*3/uL (ref 4.0–10.5)

## 2016-02-01 LAB — URINALYSIS, ROUTINE W REFLEX MICROSCOPIC
Bilirubin Urine: NEGATIVE
GLUCOSE, UA: NEGATIVE mg/dL
HGB URINE DIPSTICK: NEGATIVE
KETONES UR: NEGATIVE mg/dL
Leukocytes, UA: NEGATIVE
Nitrite: NEGATIVE
PH: 5.5 (ref 5.0–8.0)
PROTEIN: NEGATIVE mg/dL
Specific Gravity, Urine: 1.006 (ref 1.005–1.030)

## 2016-02-01 LAB — TYPE AND SCREEN
ABO/RH(D): A POS
Antibody Screen: NEGATIVE

## 2016-02-01 LAB — ABO/RH: ABO/RH(D): A POS

## 2016-02-01 LAB — APTT: APTT: 27 s (ref 24–36)

## 2016-02-01 LAB — SURGICAL PCR SCREEN
MRSA, PCR: NEGATIVE
Staphylococcus aureus: NEGATIVE

## 2016-02-01 LAB — PROTIME-INR
INR: 1.07
PROTHROMBIN TIME: 13.9 s (ref 11.4–15.2)

## 2016-02-01 NOTE — Progress Notes (Signed)
Unable to collect blood gas at PAT appointment.  Will need to collect DOS.  Left message at Dr. Nicole Cella office.

## 2016-02-01 NOTE — Pre-Procedure Instructions (Signed)
Wilgus Sween  02/01/2016      [id number] [address] - [place], [place] - [address] [place] [address] Phone: [phone] Fax: [phone]  [id number] [address], [employer] [address] [place] [address] Phone: [phone] Fax: [phone]    Your procedure is scheduled on Thursday, November 16th, 2017.  Report to [facility] Admitting at 5:30 A.M.   Call this number if you have problems the morning of surgery:  [phone]   Remember:  Do not eat food or drink liquids after midnight.   Take these medicines the morning of surgery with A SIP OF WATER: Acetaminophen (Tylenol) if needed, Eye drops if needed.  7 days prior to procedure, stop taking: Ibuprofen, Advil, Motrin, Aspirin, NSAIDS, Aleve, Naproxen, BC's, Goody's, Fish oil, all herbal medications, and all vitamins.    Do not wear jewelry.  Do not wear lotions, powders, or colognes, or deoderant.  Men may shave face and neck.  Do not bring valuables to the hospital.  [facility] is not responsible for any belongings or valuables.  Contacts, dentures or bridgework may not be worn into surgery.  Leave your suitcase in the car.  After surgery it may be brought to your room.  For patients admitted to the hospital, discharge time will be determined by your treatment team.  Patients discharged the day of surgery will not be allowed to drive home.   Special instructions:  Preparing for Surgery.   [facility]- Preparing For Surgery  Before surgery, you can play an important role. Because skin is not sterile, your skin needs to be as free of germs as possible. You can reduce the number of germs on your skin by washing with CHG (chlorahexidine gluconate) Soap before surgery.  CHG is an antiseptic cleaner which kills germs and bonds with the skin to continue killing germs even after washing.  Please do  not use if you have an allergy to CHG or antibacterial soaps. If your skin becomes reddened/irritated stop using the CHG.  Do not shave (including legs and underarms) for at least 48 hours prior to first CHG shower. It is OK to shave your face.  Please follow these instructions carefully.   1. Shower the NIGHT BEFORE SURGERY and the MORNING OF SURGERY with CHG.   2. If you chose to wash your hair, wash your hair first as usual with your normal shampoo.  3. After you shampoo, rinse your hair and body thoroughly to remove the shampoo.  4. Use CHG as you would any other liquid soap. You can apply CHG directly to the skin and wash gently with a scrungie or a clean washcloth.   5. Apply the CHG Soap to your body ONLY FROM THE NECK DOWN.  Do not use on open wounds or open sores. Avoid contact with your eyes, ears, mouth and genitals (private parts). Wash genitals (private parts) with your normal soap.  6. Wash thoroughly, paying special attention to the area where your surgery will be performed.  7. Thoroughly rinse your body with warm water from the neck down.  8. DO NOT shower/wash with your normal soap after using and rinsing off the CHG Soap.  9. Pat yourself dry with a CLEAN TOWEL.   10. Wear CLEAN PAJAMAS   11. Place CLEAN SHEETS on your bed the night of your first shower and DO NOT SLEEP WITH PETS.  Day of  Surgery: Do not apply any deodorants/lotions. Please wear clean clothes to the hospital/surgery center.    Please read over the following fact sheets that you were given. MRSA Information

## 2016-02-01 NOTE — Progress Notes (Signed)
PCP - Dr. Mayra Neer Cardiologist - Dr. Harrell Gave End for cardiac clearance (clearance note - 01/09/16)  EKG - 01/09/16 CXR - denies  Echo/stress test/cardaic cath - denies  Patient denies chest pain and shortness of breath at PAT appointment.

## 2016-02-08 NOTE — Anesthesia Preprocedure Evaluation (Addendum)
Anesthesia Evaluation  Patient identified by MRN, date of birth, ID band Patient awake    Reviewed: Allergy & Precautions, NPO status , Patient's Chart, lab work & pertinent test results  Airway Mallampati: II  TM Distance: >3 FB Neck ROM: Full    Dental  (+) Dental Advisory Given   Pulmonary former smoker,    breath sounds clear to auscultation       Cardiovascular Exercise Tolerance: Good (-) angina+ Peripheral Vascular Disease  (-) DOE  Rhythm:Regular Rate:Normal     Neuro/Psych negative neurological ROS     GI/Hepatic negative GI ROS, Neg liver ROS,   Endo/Other  negative endocrine ROS  Renal/GU negative Renal ROS     Musculoskeletal  (+) Arthritis ,   Abdominal   Peds  Hematology negative hematology ROS (+)   Anesthesia Other Findings   Reproductive/Obstetrics                           Lab Results  Component Value Date   WBC 7.8 02/01/2016   HGB 15.1 02/01/2016   HCT 44.2 02/01/2016   MCV 93.2 02/01/2016   PLT 262 02/01/2016   Lab Results  Component Value Date   CREATININE 1.00 02/01/2016   BUN 18 02/01/2016   NA 139 02/01/2016   K 4.2 02/01/2016   CL 106 02/01/2016   CO2 23 02/01/2016    Anesthesia Physical Anesthesia Plan  ASA: III  Anesthesia Plan: General   Post-op Pain Management:    Induction: Intravenous  Airway Management Planned: Oral ETT  Additional Equipment: Arterial line  Intra-op Plan:   Post-operative Plan: Extubation in OR  Informed Consent: I have reviewed the patients History and Physical, chart, labs and discussed the procedure including the risks, benefits and alternatives for the proposed anesthesia with the patient or authorized representative who has indicated his/her understanding and acceptance.   Dental advisory given  Plan Discussed with:   Anesthesia Plan Comments:        Anesthesia Quick Evaluation

## 2016-02-09 ENCOUNTER — Encounter (HOSPITAL_COMMUNITY)
Admission: RE | Disposition: A | Discharge status: Home / Self Care | Payer: Self-pay | Source: Ambulatory Visit | Attending: Vascular Surgery

## 2016-02-09 ENCOUNTER — Encounter (HOSPITAL_COMMUNITY): Payer: Self-pay | Admitting: *Deleted

## 2016-02-09 ENCOUNTER — Inpatient Hospital Stay (HOSPITAL_COMMUNITY)
Admission: RE | Admit: 2016-02-09 | Discharge: 2016-02-10 | DRG: 269 | Disposition: A | Discharge status: Home / Self Care | Payer: Medicare Other | Source: Ambulatory Visit | Attending: Vascular Surgery | Admitting: Vascular Surgery

## 2016-02-09 ENCOUNTER — Inpatient Hospital Stay (HOSPITAL_COMMUNITY): Payer: Medicare Other | Admitting: Certified Registered Nurse Anesthetist

## 2016-02-09 ENCOUNTER — Telehealth: Payer: Self-pay | Admitting: Vascular Surgery

## 2016-02-09 ENCOUNTER — Other Ambulatory Visit: Payer: Self-pay | Admitting: *Deleted

## 2016-02-09 DIAGNOSIS — Z87891 Personal history of nicotine dependence: Secondary | ICD-10-CM

## 2016-02-09 DIAGNOSIS — Z85828 Personal history of other malignant neoplasm of skin: Secondary | ICD-10-CM

## 2016-02-09 DIAGNOSIS — Z8249 Family history of ischemic heart disease and other diseases of the circulatory system: Secondary | ICD-10-CM | POA: Diagnosis not present

## 2016-02-09 DIAGNOSIS — I714 Abdominal aortic aneurysm, without rupture, unspecified: Secondary | ICD-10-CM

## 2016-02-09 DIAGNOSIS — Z95828 Presence of other vascular implants and grafts: Secondary | ICD-10-CM

## 2016-02-09 DIAGNOSIS — E785 Hyperlipidemia, unspecified: Secondary | ICD-10-CM | POA: Diagnosis not present

## 2016-02-09 DIAGNOSIS — D62 Acute posthemorrhagic anemia: Secondary | ICD-10-CM | POA: Diagnosis not present

## 2016-02-09 DIAGNOSIS — Z8679 Personal history of other diseases of the circulatory system: Secondary | ICD-10-CM

## 2016-02-09 HISTORY — DX: Other intervertebral disc degeneration, lumbar region without mention of lumbar back pain or lower extremity pain: M51.369

## 2016-02-09 HISTORY — DX: Other intervertebral disc degeneration, lumbar region: M51.36

## 2016-02-09 HISTORY — PX: ABDOMINAL AORTIC ENDOVASCULAR STENT GRAFT: SHX5707

## 2016-02-09 HISTORY — DX: Basal cell carcinoma of skin of other parts of face: C44.319

## 2016-02-09 LAB — BASIC METABOLIC PANEL
ANION GAP: 6 (ref 5–15)
BUN: 15 mg/dL (ref 6–20)
CALCIUM: 8.7 mg/dL — AB (ref 8.9–10.3)
CO2: 24 mmol/L (ref 22–32)
CREATININE: 0.83 mg/dL (ref 0.61–1.24)
Chloride: 109 mmol/L (ref 101–111)
GFR calc Af Amer: >60 mL/min (ref >60)
GLUCOSE: 136 mg/dL — AB (ref 65–99)
Potassium: 4 mmol/L (ref 3.5–5.1)
Sodium: 139 mmol/L (ref 135–145)

## 2016-02-09 LAB — BLOOD GAS, ARTERIAL
ACID-BASE EXCESS: 0.6 mmol/L (ref 0.0–2.0)
BICARBONATE: 24.4 mmol/L (ref 20.0–28.0)
Drawn by: 449841
O2 Saturation: 95.6 %
PATIENT TEMPERATURE: 98.6
pCO2 arterial: 37.4 mmHg (ref 32.0–48.0)
pH, Arterial: 7.431 (ref 7.350–7.450)
pO2, Arterial: 82.4 mmHg — ABNORMAL LOW (ref 83.0–108.0)

## 2016-02-09 LAB — CBC
HCT: 39.8 % (ref 39.0–52.0)
Hemoglobin: 14 g/dL (ref 13.0–17.0)
MCH: 32.7 pg (ref 26.0–34.0)
MCHC: 35.2 g/dL (ref 30.0–36.0)
MCV: 93 fL (ref 78.0–100.0)
PLATELETS: 227 10*3/uL (ref 150–400)
RBC: 4.28 MIL/uL (ref 4.22–5.81)
RDW: 13.6 % (ref 11.5–15.5)
WBC: 9.8 10*3/uL (ref 4.0–10.5)

## 2016-02-09 LAB — PROTIME-INR
INR: 1.28
Prothrombin Time: 16.1 seconds — ABNORMAL HIGH (ref 11.4–15.2)

## 2016-02-09 LAB — MAGNESIUM: Magnesium: 1.7 mg/dL (ref 1.7–2.4)

## 2016-02-09 LAB — APTT: aPTT: 33 seconds (ref 24–36)

## 2016-02-09 SURGERY — INSERTION, ENDOVASCULAR STENT GRAFT, AORTA, ABDOMINAL
Anesthesia: General | Site: Abdomen

## 2016-02-09 MED ORDER — PROMETHAZINE HCL 25 MG/ML IJ SOLN: 6.2500 mg | INTRAMUSCULAR | Status: DC | PRN

## 2016-02-09 MED ORDER — SUGAMMADEX SODIUM 200 MG/2ML IV SOLN
INTRAVENOUS | Status: DC | PRN
  Administered 2016-02-09: 200 mg via INTRAVENOUS

## 2016-02-09 MED ORDER — MAGNESIUM SULFATE 2 GM/50ML IV SOLN: 2.0000 g | Freq: Every day | INTRAVENOUS | Status: DC | PRN

## 2016-02-09 MED ORDER — EPHEDRINE SULFATE 50 MG/ML IJ SOLN
INTRAMUSCULAR | Status: DC | PRN
  Administered 2016-02-09: 5 mg via INTRAVENOUS

## 2016-02-09 MED ORDER — OXYCODONE-ACETAMINOPHEN 5-325 MG PO TABS
1.0000 | ORAL_TABLET | ORAL | Status: DC | PRN
  Administered 2016-02-09 – 2016-02-10 (×3): 2 via ORAL
  Filled 2016-02-09 (×3): qty 2

## 2016-02-09 MED ORDER — GLUCOSAMINE-CHONDROITIN 500-400 MG PO TABS: 2.0000 | ORAL_TABLET | Freq: Every day | ORAL | Status: DC

## 2016-02-09 MED ORDER — PROPOFOL 10 MG/ML IV BOLUS
INTRAVENOUS | Status: AC
  Filled 2016-02-09: qty 20

## 2016-02-09 MED ORDER — GUAIFENESIN-DM 100-10 MG/5ML PO SYRP: 15.0000 mL | ORAL_SOLUTION | ORAL | Status: DC | PRN

## 2016-02-09 MED ORDER — SODIUM CHLORIDE 0.9 % IV SOLN
INTRAVENOUS | Status: DC | PRN
  Administered 2016-02-09: 500 mL

## 2016-02-09 MED ORDER — DEXTROSE 5 % IV SOLN
INTRAVENOUS | Status: AC
  Filled 2016-02-09: qty 1.5

## 2016-02-09 MED ORDER — ACETAMINOPHEN 325 MG PO TABS: 325.0000 mg | ORAL_TABLET | ORAL | Status: DC | PRN

## 2016-02-09 MED ORDER — GINKGO BILOBA 40 MG PO TABS: 60.0000 mg | ORAL_TABLET | Freq: Every day | ORAL | Status: DC

## 2016-02-09 MED ORDER — CHLORHEXIDINE GLUCONATE CLOTH 2 % EX PADS: 6.0000 | MEDICATED_PAD | Freq: Once | CUTANEOUS | Status: DC

## 2016-02-09 MED ORDER — 0.9 % SODIUM CHLORIDE (POUR BTL) OPTIME
TOPICAL | Status: DC | PRN
  Administered 2016-02-09: 1000 mL

## 2016-02-09 MED ORDER — KCL IN DEXTROSE-NACL 20-5-0.45 MEQ/L-%-% IV SOLN
INTRAVENOUS | Status: AC
  Filled 2016-02-09: qty 1000

## 2016-02-09 MED ORDER — KCL IN DEXTROSE-NACL 20-5-0.45 MEQ/L-%-% IV SOLN
INTRAVENOUS | Status: DC
  Administered 2016-02-09: 100 mL/h via INTRAVENOUS
  Administered 2016-02-09: 22:00:00 via INTRAVENOUS
  Filled 2016-02-09 (×2): qty 1000

## 2016-02-09 MED ORDER — POTASSIUM CHLORIDE CRYS ER 20 MEQ PO TBCR: 20.0000 meq | EXTENDED_RELEASE_TABLET | Freq: Every day | ORAL | Status: DC | PRN

## 2016-02-09 MED ORDER — DEXTROSE 5 % IV SOLN
1.5000 g | Freq: Two times a day (BID) | INTRAVENOUS | Status: AC
  Administered 2016-02-09 – 2016-02-10 (×2): 1.5 g via INTRAVENOUS
  Filled 2016-02-09 (×2): qty 1.5

## 2016-02-09 MED ORDER — SODIUM CHLORIDE 0.9 % IV SOLN: INTRAVENOUS | Status: DC

## 2016-02-09 MED ORDER — HYDROMORPHONE HCL 1 MG/ML IJ SOLN: 0.2500 mg | INTRAMUSCULAR | Status: DC | PRN

## 2016-02-09 MED ORDER — ADULT MULTIVITAMIN W/MINERALS CH
1.0000 | ORAL_TABLET | Freq: Every day | ORAL | Status: DC
  Filled 2016-02-09: qty 1

## 2016-02-09 MED ORDER — SODIUM CHLORIDE 0.9 % IV SOLN: 500.0000 mL | Freq: Once | INTRAVENOUS | Status: DC | PRN

## 2016-02-09 MED ORDER — ALUM & MAG HYDROXIDE-SIMETH 200-200-20 MG/5ML PO SUSP: 15.0000 mL | ORAL | Status: DC | PRN

## 2016-02-09 MED ORDER — ONDANSETRON HCL 4 MG/2ML IJ SOLN: 4.0000 mg | Freq: Four times a day (QID) | INTRAMUSCULAR | Status: DC | PRN

## 2016-02-09 MED ORDER — OCUVITE ADULT 50+ PO CAPS: 1.0000 | ORAL_CAPSULE | Freq: Every day | ORAL | Status: DC

## 2016-02-09 MED ORDER — LACTATED RINGERS IV SOLN
INTRAVENOUS | Status: DC | PRN
  Administered 2016-02-09: 07:00:00 via INTRAVENOUS

## 2016-02-09 MED ORDER — MORPHINE SULFATE (PF) 2 MG/ML IV SOLN: 2.0000 mg | INTRAVENOUS | Status: DC | PRN

## 2016-02-09 MED ORDER — ACETAMINOPHEN 500 MG PO TABS: 1000.0000 mg | ORAL_TABLET | Freq: Every day | ORAL | Status: DC | PRN

## 2016-02-09 MED ORDER — PROPOFOL 10 MG/ML IV BOLUS
INTRAVENOUS | Status: DC | PRN
  Administered 2016-02-09: 150 mg via INTRAVENOUS

## 2016-02-09 MED ORDER — ROCURONIUM BROMIDE 100 MG/10ML IV SOLN
INTRAVENOUS | Status: DC | PRN
  Administered 2016-02-09: 10 mg via INTRAVENOUS
  Administered 2016-02-09: 40 mg via INTRAVENOUS

## 2016-02-09 MED ORDER — FENTANYL CITRATE (PF) 250 MCG/5ML IJ SOLN
INTRAMUSCULAR | Status: AC
  Filled 2016-02-09: qty 5

## 2016-02-09 MED ORDER — PHENYLEPHRINE HCL 10 MG/ML IJ SOLN
INTRAMUSCULAR | Status: DC | PRN
  Administered 2016-02-09: 15 ug/min via INTRAVENOUS

## 2016-02-09 MED ORDER — ONDANSETRON HCL 4 MG/2ML IJ SOLN
INTRAMUSCULAR | Status: DC | PRN
  Administered 2016-02-09: 4 mg via INTRAVENOUS

## 2016-02-09 MED ORDER — PROTAMINE SULFATE 10 MG/ML IV SOLN
INTRAVENOUS | Status: DC | PRN
  Administered 2016-02-09: 40 mg via INTRAVENOUS

## 2016-02-09 MED ORDER — VITAMIN D 1000 UNITS PO TABS
1000.0000 [IU] | ORAL_TABLET | Freq: Every day | ORAL | Status: DC
  Filled 2016-02-09: qty 1

## 2016-02-09 MED ORDER — SIMVASTATIN 20 MG PO TABS
20.0000 mg | ORAL_TABLET | Freq: Every evening | ORAL | Status: DC
  Filled 2016-02-09: qty 1

## 2016-02-09 MED ORDER — METOPROLOL TARTRATE 5 MG/5ML IV SOLN: 2.0000 mg | INTRAVENOUS | Status: DC | PRN

## 2016-02-09 MED ORDER — LIDOCAINE HCL (CARDIAC) 20 MG/ML IV SOLN
INTRAVENOUS | Status: DC | PRN
  Administered 2016-02-09: 80 mg via INTRAVENOUS

## 2016-02-09 MED ORDER — FENTANYL CITRATE (PF) 100 MCG/2ML IJ SOLN
INTRAMUSCULAR | Status: DC | PRN
  Administered 2016-02-09: 100 ug via INTRAVENOUS

## 2016-02-09 MED ORDER — ENOXAPARIN SODIUM 40 MG/0.4ML ~~LOC~~ SOLN
40.0000 mg | SUBCUTANEOUS | Status: DC
  Administered 2016-02-10: 40 mg via SUBCUTANEOUS
  Filled 2016-02-09: qty 0.4

## 2016-02-09 MED ORDER — MIDAZOLAM HCL 2 MG/2ML IJ SOLN
INTRAMUSCULAR | Status: AC
  Filled 2016-02-09: qty 2

## 2016-02-09 MED ORDER — ACETAMINOPHEN 325 MG RE SUPP
325.0000 mg | RECTAL | Status: DC | PRN
Start: 2016-02-09 — End: 2016-02-10

## 2016-02-09 MED ORDER — OCUVITE-LUTEIN PO CAPS
1.0000 | ORAL_CAPSULE | Freq: Every day | ORAL | Status: DC
  Filled 2016-02-09: qty 1

## 2016-02-09 MED ORDER — PANTOPRAZOLE SODIUM 40 MG PO TBEC
40.0000 mg | DELAYED_RELEASE_TABLET | Freq: Every day | ORAL | Status: DC
  Administered 2016-02-10: 40 mg via ORAL
  Filled 2016-02-09: qty 1

## 2016-02-09 MED ORDER — HEPARIN SODIUM (PORCINE) 1000 UNIT/ML IJ SOLN
INTRAMUSCULAR | Status: DC | PRN
  Administered 2016-02-09: 7000 [IU] via INTRAVENOUS

## 2016-02-09 MED ORDER — IODIXANOL 320 MG/ML IV SOLN
INTRAVENOUS | Status: DC | PRN
  Administered 2016-02-09: 90.8 mL via INTRAVENOUS

## 2016-02-09 MED ORDER — GARLIC 500 MG PO CAPS: 500.0000 mg | ORAL_CAPSULE | Freq: Every day | ORAL | Status: DC

## 2016-02-09 MED ORDER — HYPROMELLOSE (GONIOSCOPIC) 2.5 % OP SOLN: 1.0000 [drp] | Freq: Four times a day (QID) | OPHTHALMIC | Status: DC | PRN

## 2016-02-09 MED ORDER — POLYVINYL ALCOHOL 1.4 % OP SOLN
1.0000 [drp] | Freq: Four times a day (QID) | OPHTHALMIC | Status: DC | PRN
  Filled 2016-02-09: qty 15

## 2016-02-09 MED ORDER — LABETALOL HCL 5 MG/ML IV SOLN: 10.0000 mg | INTRAVENOUS | Status: DC | PRN

## 2016-02-09 MED ORDER — IBUPROFEN 200 MG PO TABS: 400.0000 mg | ORAL_TABLET | Freq: Every day | ORAL | Status: DC | PRN

## 2016-02-09 MED ORDER — PHENOL 1.4 % MT LIQD: 1.0000 | OROMUCOSAL | Status: DC | PRN

## 2016-02-09 MED ORDER — DOCUSATE SODIUM 100 MG PO CAPS
100.0000 mg | ORAL_CAPSULE | Freq: Every day | ORAL | Status: DC
  Administered 2016-02-10: 100 mg via ORAL
  Filled 2016-02-09: qty 1

## 2016-02-09 MED ORDER — DEXTROSE 5 % IV SOLN
1.5000 g | INTRAVENOUS | Status: AC
  Administered 2016-02-09: 1.5 g via INTRAVENOUS

## 2016-02-09 MED ORDER — HYDRALAZINE HCL 20 MG/ML IJ SOLN: 5.0000 mg | INTRAMUSCULAR | Status: DC | PRN

## 2016-02-09 SURGICAL SUPPLY — 68 items
ADH SKN CLS APL DERMABOND .7 (GAUZE/BANDAGES/DRESSINGS) ×1
ADH SKN CLS LQ APL DERMABOND (GAUZE/BANDAGES/DRESSINGS)
BLADE SURG CLIPPER 3M 9600 (MISCELLANEOUS) ×3 IMPLANT
CANISTER SUCTION 2500CC (MISCELLANEOUS) ×3 IMPLANT
CATH ACCU-VU SIZ PIG 5F 70CM (CATHETERS) IMPLANT
CATH ANGIO 5F BER2 65CM (CATHETERS) ×2 IMPLANT
CATH BEACON 5.038 65CM KMP-01 (CATHETERS) IMPLANT
CATH OMNI FLUSH .035X70CM (CATHETERS) ×2 IMPLANT
COVER PROBE W GEL 5X96 (DRAPES) IMPLANT
DERMABOND ADHESIVE PROPEN (GAUZE/BANDAGES/DRESSINGS)
DERMABOND ADVANCED (GAUZE/BANDAGES/DRESSINGS) ×2
DERMABOND ADVANCED .7 DNX12 (GAUZE/BANDAGES/DRESSINGS) ×1 IMPLANT
DERMABOND ADVANCED .7 DNX6 (GAUZE/BANDAGES/DRESSINGS) IMPLANT
DEVICE CLOSURE PERCLS PRGLD 6F (VASCULAR PRODUCTS) IMPLANT
DEVICE TORQUE KENDALL .025-038 (MISCELLANEOUS) ×2 IMPLANT
DRSG TEGADERM 2-3/8X2-3/4 SM (GAUZE/BANDAGES/DRESSINGS) ×6 IMPLANT
DRYSEAL FLEXSHEATH 12FR 33CM (SHEATH) ×2
DRYSEAL FLEXSHEATH 16FR 33CM (SHEATH) ×2
DRYSEAL FLEXSHEATH 18FR 33CM (SHEATH) ×2
ELECT CAUTERY BLADE 6.4 (BLADE) ×2 IMPLANT
ELECT REM PT RETURN 9FT ADLT (ELECTROSURGICAL) ×6
ELECTRODE REM PT RTRN 9FT ADLT (ELECTROSURGICAL) ×2 IMPLANT
EXCLUDER TNK LEG 26MX14X16 (Endovascular Graft) IMPLANT
EXCLUDER TRUNK LEG 26MX14X16 (Endovascular Graft) ×3 IMPLANT
EXTENDER ENDOPROSTHESIS 14X7 (Endovascular Graft) ×2 IMPLANT
GAUZE SPONGE 2X2 8PLY STRL LF (GAUZE/BANDAGES/DRESSINGS) ×2 IMPLANT
GLOVE BIO SURGEON STRL SZ7 (GLOVE) ×2 IMPLANT
GLOVE BIO SURGEON STRL SZ7.5 (GLOVE) ×3 IMPLANT
GLOVE BIOGEL PI IND STRL 6.5 (GLOVE) IMPLANT
GLOVE BIOGEL PI IND STRL 7.5 (GLOVE) IMPLANT
GLOVE BIOGEL PI IND STRL 8 (GLOVE) ×1 IMPLANT
GLOVE BIOGEL PI INDICATOR 6.5 (GLOVE) ×2
GLOVE BIOGEL PI INDICATOR 7.5 (GLOVE) ×2
GLOVE BIOGEL PI INDICATOR 8 (GLOVE) ×2
GLOVE ECLIPSE 7.5 STRL STRAW (GLOVE) ×4 IMPLANT
GOWN STRL REUS W/ TWL LRG LVL3 (GOWN DISPOSABLE) ×3 IMPLANT
GOWN STRL REUS W/ TWL XL LVL3 (GOWN DISPOSABLE) IMPLANT
GOWN STRL REUS W/TWL LRG LVL3 (GOWN DISPOSABLE) ×6
GOWN STRL REUS W/TWL XL LVL3 (GOWN DISPOSABLE) ×6
GRAFT BALLN CATH 65CM (STENTS) IMPLANT
GUIDEWIRE ANGLED .035X150CM (WIRE) ×2 IMPLANT
KIT BASIN OR (CUSTOM PROCEDURE TRAY) ×3 IMPLANT
KIT ROOM TURNOVER OR (KITS) ×3 IMPLANT
LEG CONTRALATERAL 16X14.5X14 (Vascular Products) ×2 IMPLANT
NDL PERC 18GX7CM (NEEDLE) ×1 IMPLANT
NEEDLE PERC 18GX7CM (NEEDLE) ×6 IMPLANT
NS IRRIG 1000ML POUR BTL (IV SOLUTION) ×3 IMPLANT
PACK ENDOVASCULAR (PACKS) ×3 IMPLANT
PAD ARMBOARD 7.5X6 YLW CONV (MISCELLANEOUS) ×6 IMPLANT
PENCIL BUTTON HOLSTER BLD 10FT (ELECTRODE) ×2 IMPLANT
PERCLOSE PROGLIDE 6F (VASCULAR PRODUCTS) ×12
SHEATH AVANTI 11CM 8FR (MISCELLANEOUS) ×2 IMPLANT
SHEATH BRITE TIP 8FR 23CM (MISCELLANEOUS) ×2 IMPLANT
SHEATH DRYSEAL FLEX 12FR 33CM (SHEATH) IMPLANT
SHEATH DRYSEAL FLEX 16FR 33CM (SHEATH) IMPLANT
SHEATH DRYSEAL FLEX 18FR 33CM (SHEATH) IMPLANT
SPONGE GAUZE 2X2 STER 10/PKG (GAUZE/BANDAGES/DRESSINGS) ×2
STAPLER VISISTAT (STAPLE) IMPLANT
STENT GRAFT BALLN CATH 65CM (STENTS) ×2
STOPCOCK MORSE 400PSI 3WAY (MISCELLANEOUS) ×5 IMPLANT
SUT PROLENE 5 0 C 1 24 (SUTURE) IMPLANT
SUT VICRYL 4-0 PS2 18IN ABS (SUTURE) ×6 IMPLANT
SYR 30ML LL (SYRINGE) ×3 IMPLANT
TOWEL OR 17X24 6PK STRL BLUE (TOWEL DISPOSABLE) ×2 IMPLANT
TRAY FOLEY W/METER SILVER 16FR (SET/KITS/TRAYS/PACK) ×3 IMPLANT
TUBING HIGH PRESSURE 120CM (CONNECTOR) ×5 IMPLANT
WIRE AMPLATZ SS-J .035X180CM (WIRE) ×4 IMPLANT
WIRE BENTSON .035X145CM (WIRE) ×6 IMPLANT

## 2016-02-09 NOTE — Telephone Encounter (Signed)
Sched appts 03/21/16. CTA at [facility] at 12:00. CSD at 1:00. Lm on cell# to inform pt of appts.

## 2016-02-09 NOTE — Anesthesia Postprocedure Evaluation (Signed)
Anesthesia Post Note  Patient: [name] [name]  Procedure(s) Performed: Procedure(s) (LRB): ABDOMINAL AORTIC ENDOVASCULAR STENT GRAFT (N/A)  Patient location during evaluation: PACU Anesthesia Type: General Level of consciousness: awake and alert Pain management: pain level controlled Vital Signs Assessment: post-procedure vital signs reviewed and stable Respiratory status: spontaneous breathing, nonlabored ventilation, respiratory function stable and patient connected to nasal cannula oxygen Cardiovascular status: blood pressure returned to baseline and stable Postop Assessment: no signs of nausea or vomiting Anesthetic complications: no    Last Vitals:  Vitals:   02/09/16 1100 02/09/16 1130  BP:    Pulse: (!) 45   Resp: 13   Temp:  36.4 C    Last Pain:  Vitals:   02/09/16 1130  TempSrc:   PainSc: 0-No pain                 Tiajuana Amass

## 2016-02-09 NOTE — H&P (Signed)
Patient name: [name] [name]          MRN: [id number]        DOB: Dec 23, 1940          Sex: male  REASON FOR VISIT: To discuss endovascular aneurysm repair.  HPI: [name] [name] is a 75 y.o. male who I have been following with an abdominal aortic aneurysm. When I last saw him in September, the aneurysm had enlarged to 5.6 cm by ultrasound. Therefore I thought we should consider elective repair given the tentative 10% per year  risk of rupture for an aneurysm of that size. He underwent a CT angiogram to evaluate him for endovascular repair and comes in to discuss those results.  He denies any abdominal pain or back pain. He is very active and works out quite a bit.      Past Medical History:  Diagnosis Date  . AAA (abdominal aortic aneurysm) ([place])   . Arthritis Aug. 2011   Degenerative disk disease- low back  . Cancer ([place]) 2012   MOHS-face, BCC  . Chronic low back pain   . Hyperlipidemia           Family History  Problem Relation Age of Onset  . Cancer Mother     Ovarian  . Heart disease Father     SOCIAL HISTORY:       Social History  Substance Use Topics  . Smoking status: Former Smoker    Types: Cigarettes    Quit date: 03/27/1999  . Smokeless tobacco: Never Used  . Alcohol use 0.0 oz/week     2 - 3 Glasses of wine per week         Allergies  Allergen Reactions  . Bee Venom           Current Outpatient Prescriptions  Medication Sig Dispense Refill  . cholecalciferol (VITAMIN D) 1000 UNITS tablet Take 1,000 Units by mouth daily.    Marland Kitchen doxycycline (VIBRA-TABS) 100 MG tablet Take 100 mg by mouth 2 (two) times daily. Seven days only  0  . Garlic (GARLIQUE PO) Take by mouth.    Marland Kitchen glucosamine-chondroitin 500-400 MG tablet Take 1 tablet by mouth 3 (three) times daily.    Marland Kitchen ibuprofen (ADVIL,MOTRIN) 100 MG tablet Take 100 mg by mouth every 6 (six) hours as needed.    . Multiple Vitamin (MULTI-VITAMIN DAILY PO) Take by mouth.    .  simvastatin (ZOCOR) 20 MG tablet Take 20 mg by mouth every other day.     No current facility-administered medications for this visit.     REVIEW OF SYSTEMS:  [X]  denotes positive finding, [ ]  denotes negative finding Cardiac  Comments:  Chest pain or chest pressure:    Shortness of breath upon exertion:    Short of breath when lying flat:    Irregular heart rhythm:        Vascular    Pain in calf, thigh, or hip brought on by ambulation:    Pain in feet at night that wakes you up from your sleep:     Blood clot in your veins:    Leg swelling:         Pulmonary    Oxygen at home:    Productive cough:     Wheezing:         Neurologic    Sudden weakness in arms or legs:     Sudden numbness in arms or legs:     Sudden onset of difficulty speaking  or slurred speech:    Temporary loss of vision in one eye:     Problems with dizziness:         Gastrointestinal    Blood in stool:     Vomited blood:         Genitourinary    Burning when urinating:     Blood in urine:        Psychiatric    Major depression:         Hematologic    Bleeding problems:    Problems with blood clotting too easily:        Skin    Rashes or ulcers:        Constitutional    Fever or chills:      PHYSICAL EXAM: Vitals:   02/09/16 0620  BP: (!) 168/82  Pulse: 71  Resp: 20  Temp: 97.9 F (36.6 C)      Vitals:   12/28/15 0934  BP: 120/80  Pulse: (!) 58  Resp: 16  Temp: 97.5 F (36.4 C)  TempSrc: Oral  SpO2: 97%  Weight: 150 lb (68 kg)  Height: 5\' 5"  (1.651 m)    GENERAL: The patient is a well-nourished male, in no acute distress. The vital signs are documented above. CARDIAC: There is a regular rate and rhythm.  VASCULAR: I do not detect carotid bruits. He has palpable femoral, popliteal, dorsalis pedis, and posterior tibial pulses bilaterally. He has no significant lower  extremity swelling. PULMONARY: There is good air exchange bilaterally without wheezing or rales. ABDOMEN: Soft and non-tender with normal pitched bowel sounds.  His aneurysm is palpable and nontender. MUSCULOSKELETAL: There are no major deformities or cyanosis. NEUROLOGIC: No focal weakness or paresthesias are detected. SKIN: There are no ulcers or rashes noted. PSYCHIATRIC: The patient has a normal affect.  DATA:    CT ANGIOGRAM: I have reviewed his CT angiogram. The maximum diameter of the aneurysm is 5.8 cm. He does appear to be a candidate for endovascular repair.  MEDICAL ISSUES:    5.8 CM INFRARENAL ABDOMINAL AORTIC ANEURYSM: Given the size of his aneurysm I have recommended elective repair. He appears to be a good candidate for an endovascular repair. I have discussed the indications for aneurysm repair. I have explained that without repair, the risk of the aneurysm rupturing is 5-10% per year. We have discussed the advantages and disadvantages of open versus endovascular repair. The patient wishes to proceed with endovascular aneurysm repair (EVAR). I have discussed the potential complications of EVAR, including, but not limited to: bleeding, infection, arterial injury, graft migration, endoleak, renal failure, MI or other unpredictable medical problems. We have discussed the possibility of having to convert to open repair. We also discussed the need for continued lifelong follow-up after EVAR. All of the patients questions were answered and they are agreeable to proceed with surgery. His cardiac clearance is in progress and we will schedule surgery as soon as this is complete.     Deitra Mayo [facility] [place] [phone]

## 2016-02-09 NOTE — Telephone Encounter (Signed)
-----   Message from Mena Goes, RN sent at 02/09/2016 11:43 AM EST ----- Regarding: schedule CTA and postop     ----- Message ----- From: Angelia Mould, MD Sent: 02/09/2016   9:45 AM To: [facility] Charge Pool Subject: f/u                                            He will need a CT angiogram of the abdomen and pelvis in 1 month with a follow up visit. Thank you.

## 2016-02-09 NOTE — Op Note (Signed)
OPERATIVE NOTE   PROCEDURE: 1. Left common femoral artery cannulation under ultrasound guidance 2. "Preclose" repair of left common femoral artery  3. Placement of catheter in aorta  4. Placement of left iliac limb (14 mm x 14 cm) 5. Placement of left iliac limb extension (14 mm x 7 cm)  (Dictated by Dr. Scot Dock) 6. Right common femoral artery cannulation under ultrasound guidance 7. "Preclose" repair of right common femoral artery  6. Placement of catheter in aorta  7. Aortogram 8. Placement of bifurcated aortic endograft (26 mm x 14 mm x 16 cm)  9. Radiologic S&I   PRE-OPERATIVE DIAGNOSIS: asymptomatic large abdominal aortic aneurysm  POST-OPERATIVE DIAGNOSIS: same as above   CO-SURGEONS: Gae Gallop, MD; Adele Barthel, MD  ANESTHESIA: general  ESTIMATED BLOOD LOSS: 50 cc  FINDING(S): 1. Successful exclusion of abdominal aortic aneurysm with preservation of bilateral renal arteries  2. Patent bilateral internal iliac arteries 3. Small type II endoleaks   SPECIMEN(S): none  INDICATIONS:  Jader Kos is a 75 y.o. (30-Jun-1940) male who presents with large asymptomatic abdominal aortic aneurysm. The patient is aware the risks of endovascular aortic surgery include but are not limited to: bleeding, need for transfusion, infection, death, stroke, paralysis, wound complications, bowel ischemia, pelvic ischemia, extended ventilation, anaphylactic reaction to contrast, contrast induced nephropathy, embolism, and need for additional procedure to address endoleaks. Overall, Dr. Scot Dock cited a mortality rate of 1-2% and morbidity rate of 15%.  DESCRIPTION: After obtaining full informed written consent, the patient was brought back to the operating room and placed supine upon the operating table. The patient received IV antibiotics prior to induction. After obtaining adequate anesthesia, the patient was prepped and draped in the standard fashion for:  open and endovascular aortic repair.  Dr. Scot Dock performed the "preclose" technique on the right common femoral artery to gain access.  Please see his notes for the detail.  The right wire was exchanged for an Amplatz wire.  At this point, I turned my attention to the left groin. Under Sonosite guidance, I cannulated the left common femoral artery with a 18 gauge needle. I passed a Bentson wire up the common femoral arteryinto the distal aorta.  The subcutaneous tract was dilated with a 8-Fr dilator. The first Proglide device was loaded over the wire into the artery, the wire was removed and the device advanced until a flash of blood was obtained through the side port. I deployed the device with 30 degrees medial rotation. The proglide sutures were removed and tagged. The Bentson wire was reloaded through the used Proglide device. I loaded another Proglide over the wire into the left common iliac artery artery. The device was deployed in a similar fashion, this time at 30 degree lateral rotation. A 8-Fr long sheath was loaded into the distal aorta under fluoroscopic guidance.   At this point, Dr. Scot Dock exchanged the right sheath for 16-Fr Dryseal sheath. I loaded the pigtail catheter over the left wire into the suprarenal aorta. The wire was removed and the catheter connected to the power injector in the suprarenal position..   Dr. Scot Dock then loaded the main body through the right sheath: C3 26 mm x 14 mm x 16 cm in the infrarenal position. A power injector aortogram was completed to identify the location of the right renal artery which was the lowest renal artery. He deployed the main body main body in the immediate infrarenal position. Completion aortogram demonstrated good position of the main body without any  coverage of the renal arteries.   At this point, using a BER-2 and Glidewire, I was able to get into the contralateral gate. I advanced the catheter and wire into  the suprarenal aorta. The wire was exchanged for an amplatz wire.  The catheter was exchanged for a pigtail catheter. The wire was pulled down and the pigtail catheter was rotated at the top of the endograft to demonstrated intragraft position of the catheter. The Amplatz wire was replaced in this catheter and the catheter pulled down to the flow divider.   A retrograde injection from the left femoral sheath demonstrated the position of the left internal iliac artery. Based on the length measurements, a 14 mm x 14 cm was selected. I positioned this limb extension with adequate overlap and deployed the limb. The stent delivery device was removed. The tortuousity of the iliac artery caused the end of the limb to terminate at the bend in the left common iliac artery.  I elected to extend the left iliac limb past the bend.  A 14 mm x 7 cm iliac limb extension was deployed with adequate overlap.  This maneuver extended the left limb past the bend in the left common iliac artery, proximal to the internal iliac artery.  The stent delivery system was removed.    At this point, he fully released the ipsilateral limb and detached the main body. The stent delivery device was removed.  Dr. Scot Dock performed a retrograde injection on the right side to confirm that the right internal iliac artery position. There appeared to be adequate overlap in the right common iliac artery.    At this point, I passed the Q50 balloon up the left sheath. I inflated the molding balloon at the proximal aortic endograft, at the flow divider, at the bridging pieces, left common iliac artery bend, and at the end of the left iliac limb. The balloon was deflated and placed on the right wire. Dr. Scot Dock nflated the balloon at the end of the right limb.    A pigtail catheter was loaded on the left side. A completion aortogram was completed. Based on the images, there appeared to be a small Type 2 endoleak, a possible type I  endoleak, widely patent bilateral renal and internal iliac arteries.  Dr. Griffith Citron the molding balloon on the right wire and reinflated the balloon at the proximal neck, remolding the graft against the infrarenal aorta.  On completion aortogram, the Type I endoleak was completed gone.  There remained small delayed Type II endoleaks.  At this point, I placed a Bentson wire in the left pigtail catheter and removed the catheter. I help Dr. Scot Dock with the Preclose repair of the Right common femoral artery.  See his Op Notes for details.  I then removed the left common femoral artery while Dr. Scot Dock removed the left sheath. I repaired the left common femoral artery with Preclose technique by sequentially cinching down the Proglide sutures first with the wire in place and finally without the wire in place.  The two Proglide sutures were held tight with a hemostat.  Stitches in both groins were transected with the suture cutter.    At this point, the patient was given Protamine to reverse anticoagulation (see Dr. Nicole Cella notes for details). The skin in both groins was repaired with a U-stitch of 4-0 Vicryl. The skin was cleaned, dried, and reinforced with Dermabond.   COMPLICATIONS: none  CONDITION: stable   Adele Barthel, MD, [facility]  [facility] [place] Office: [phone] Pager: [phone]

## 2016-02-09 NOTE — Progress Notes (Signed)
   VASCULAR SURGERY POST OP NOTE:  Stable post op.  Anticipate discharge in AM  SUBJECTIVE: No complaints  PHYSICAL EXAM: Vitals:   02/09/16 1515 02/09/16 1525 02/09/16 1530 02/09/16 1550  BP:  133/70  (!) 146/80  Pulse: (!) 56 84  63  Resp: 14 17  (!) 9  Temp:   97.4 F (36.3 C) 98.2 F (36.8 C)  TempSrc:    Oral  SpO2: 100% 100%  100%  Weight:       Groins look fine Palpable DP pulses  LABS: Lab Results  Component Value Date   WBC 9.8 02/09/2016   HGB 14.0 02/09/2016   HCT 39.8 02/09/2016   MCV 93.0 02/09/2016   PLT 227 02/09/2016   Lab Results  Component Value Date   CREATININE 0.83 02/09/2016    Active Problems:   History of abdominal aortic aneurysm (AAA)  Gae Gallop Beeper: [id number] 02/09/2016

## 2016-02-09 NOTE — Op Note (Signed)
    NAME: [name] [name]   MRN: [id number] DOB: Jul 09, 1940    DATE OF OPERATION: 02/09/2016  PREOP DIAGNOSIS: 5.8 cm infrarenal abdominal aortic aneurysm  POSTOP DIAGNOSIS: Same  PROCEDURE: Percutaneous endovascular repair of abdominal aortic aneurysm using 3 components.  CO-SURGEON'S: Judeth Cornfield. Scot Dock, MD, Adele Barthel, MD  ANESTHESIA: Gen.   EBL: Minimal  INDICATIONS: [name] [name] is a 75 y.o. male who presents for elective repair of a 5.8 cm infrarenal abdominal aortic aneurysm. He was felt to be a good candidate for endovascular repair.  FINDINGS: Completion arteriogram showed no type 1 endoleak. Both renal arteries were patent and there were good seals proximally and distally.  TECHNIQUE: The patient was taken to the operating room and received a general anesthetic. The abdomen and both groins were prepped and draped in usual sterile fashion. On the right side, under ultrasound guidance, the right common femoral artery was cannulated under ultrasound guidance and the artery was preclosed using 2 Perclose devices. Initial device was rotated 10 medially and the second device was rotated 10 laterally. A short 8 French sheath was then placed on the right.  On the left side the left common femoral artery was also preclosed with 2 Perclose devices as dictated separately. The pigtail catheter was positioned from the left side. On the right side I exchanged the 8 French sheath for initially a 16 [place] sheath which was later converted to an 81 [place] sheath. This was done after the patient was heparinized. The trunk ipsilateral component was selected. This was a 26 mm x 14 mm x 16 cm device. This was advanced over the wire through the 18 sheath with the gate positioned anteriorly based on how the wires were positioned. Arteriogram was obtained to demonstrate the position of the renal arteries using a cranial projection in RAO projection for best visualization of the neck. The proximal graft  was deployed without difficulty below the renal arteries and was in good position.  Next the contralateral gate was cannulated and the contralateral pieces placed as dictated separately by Dr. Bridgett Larsson. Back on the right side the ipsilateral limb was deployed. Completion film showed that it was above the hypogastric artery with an excellent seal and therefore no further pieces were required on the right side. The proximal graft, aortic bifurcation and distal graft were all ballooned with the Q 50 balloon. Completion film showed possibly a small type I endoleak and therefore the proximal neck was ballooned again in follow up film showed no evidence of type I endoleak. The graft was in good position with possibly small type II endoleak in the midportion of the graft.  Next on the right side the sheath was removed and pressure held while the Perclose devices were secured and there was good hemostasis. Likewise on the left side the sheath was removed and the Perclose device is secured with good hemostasis after the wire was removed. Each of the groin incision was closed with 4-0 Monocryl. Patient tolerated the procedure well and was transferred to the recovery room in stable condition. All needle and sponge counts were correct. Both feet were warm and well-perfused at the completion of the procedure.  Deitra Mayo, MD, FACS [facility]  DATE OF DICTATION:   02/09/2016

## 2016-02-09 NOTE — Anesthesia Procedure Notes (Signed)
Procedure Name: Intubation Date/Time: 02/09/2016 7:50 AM Performed by: Oletta Lamas Pre-anesthesia Checklist: Patient identified, Emergency Drugs available, Suction available and Patient being monitored Patient Re-evaluated:Patient Re-evaluated prior to inductionOxygen Delivery Method: Circle System Utilized Preoxygenation: Pre-oxygenation with 100% oxygen Intubation Type: IV induction Ventilation: Mask ventilation without difficulty Laryngoscope Size: Mac and 3 Grade View: Grade I Tube type: Oral Number of attempts: 1 Airway Equipment and Method: Stylet and Oral airway Placement Confirmation: ETT inserted through vocal cords under direct vision,  positive ETCO2 and breath sounds checked- equal and bilateral Secured at: 23 cm Tube secured with: Tape Dental Injury: Teeth and Oropharynx as per pre-operative assessment

## 2016-02-09 NOTE — Transfer of Care (Signed)
Immediate Anesthesia Transfer of Care Note  Patient: [name] [name]  Procedure(s) Performed: Procedure(s): ABDOMINAL AORTIC ENDOVASCULAR STENT GRAFT (N/A)  Patient Location: PACU  Anesthesia Type:General  Level of Consciousness: awake, alert , oriented and patient cooperative  Airway & Oxygen Therapy: Patient Spontanous Breathing and Patient connected to nasal cannula oxygen  Post-op Assessment: Report given to RN and Post -op Vital signs reviewed and stable  Post vital signs: Reviewed and stable  Last Vitals:  Vitals:   02/09/16 0620 02/09/16 0939  BP: (!) 168/82   Pulse: 71   Resp: 20   Temp: 36.6 C 36.6 C    Last Pain:  Vitals:   02/09/16 0620  TempSrc: Oral      Patients Stated Pain Goal: 3 ([id number])  Complications: No apparent anesthesia complications

## 2016-02-10 ENCOUNTER — Other Ambulatory Visit: Payer: Self-pay | Admitting: *Deleted

## 2016-02-10 ENCOUNTER — Encounter (HOSPITAL_COMMUNITY): Payer: Self-pay | Admitting: Vascular Surgery

## 2016-02-10 DIAGNOSIS — Z95828 Presence of other vascular implants and grafts: Secondary | ICD-10-CM

## 2016-02-10 DIAGNOSIS — I714 Abdominal aortic aneurysm, without rupture, unspecified: Secondary | ICD-10-CM

## 2016-02-10 LAB — CBC
HEMATOCRIT: 36.8 % — AB (ref 39.0–52.0)
HEMOGLOBIN: 12.2 g/dL — AB (ref 13.0–17.0)
MCH: 31 pg (ref 26.0–34.0)
MCHC: 33.2 g/dL (ref 30.0–36.0)
MCV: 93.6 fL (ref 78.0–100.0)
PLATELETS: 204 10*3/uL (ref 150–400)
RBC: 3.93 MIL/uL — AB (ref 4.22–5.81)
RDW: 13.7 % (ref 11.5–15.5)
WBC: 10.2 10*3/uL (ref 4.0–10.5)

## 2016-02-10 LAB — BASIC METABOLIC PANEL
ANION GAP: 7 (ref 5–15)
BUN: 10 mg/dL (ref 6–20)
CHLORIDE: 105 mmol/L (ref 101–111)
CO2: 24 mmol/L (ref 22–32)
Calcium: 8.2 mg/dL — ABNORMAL LOW (ref 8.9–10.3)
Creatinine, Ser: 0.89 mg/dL (ref 0.61–1.24)
GFR calc Af Amer: >60 mL/min (ref >60)
GLUCOSE: 117 mg/dL — AB (ref 65–99)
POTASSIUM: 3.8 mmol/L (ref 3.5–5.1)
Sodium: 136 mmol/L (ref 135–145)

## 2016-02-10 MED ORDER — OXYCODONE-ACETAMINOPHEN 5-325 MG PO TABS: 1.0000 | ORAL_TABLET | Freq: Four times a day (QID) | ORAL | 0 refills | Status: DC | PRN

## 2016-02-10 MED ORDER — ASPIRIN EC 81 MG PO TBEC: 81.0000 mg | DELAYED_RELEASE_TABLET | Freq: Every day | ORAL | Status: DC

## 2016-02-10 MED ORDER — PROSIGHT PO TABS
1.0000 | ORAL_TABLET | Freq: Every day | ORAL | Status: DC
  Filled 2016-02-10: qty 1

## 2016-02-10 MED ORDER — ONDANSETRON 4 MG PO TBDP
4.0000 mg | ORAL_TABLET | Freq: Once | ORAL | Status: AC
  Administered 2016-02-10: 4 mg via ORAL
  Filled 2016-02-10: qty 1

## 2016-02-10 NOTE — Progress Notes (Signed)
Pt vomited 2x of undigested food.  PO Zofran administered.  PA notified.  Plan is to keep pt until after lunch and restart IV fluid if pt still nauseated.  Groin sites clean and intact.  No additional discomfort stated by pt.  Will continue to monitor.

## 2016-02-10 NOTE — Progress Notes (Signed)
   VASCULAR SURGERY ASSESSMENT & PLAN:  1 Day Post-Op s/p: PEVAR  Doing well. No problems  D/C today. F/U in 1 month with a CT scan.  VQI: Is on a statin and he will begin taking ASA 81 mg daily.  SUBJECTIVE: No complaints  PHYSICAL EXAM: Vitals:   02/10/16 0000 02/10/16 0400 02/10/16 0600 02/10/16 0720  BP:  (!) 111/56 99/60 123/64  Pulse:  60 (!) 59 64  Resp:  13 16 12   Temp:  98.9 F (37.2 C)    TempSrc:  Oral    SpO2:  95% 94% 95%  Weight: 151 lb 0.2 oz (68.5 kg)     Height: 5\' 5"  (1.651 m)      Palpable DP pusles Groin sites look fine.   LABS: Lab Results  Component Value Date   WBC 10.2 02/10/2016   HGB 12.2 (L) 02/10/2016   HCT 36.8 (L) 02/10/2016   MCV 93.6 02/10/2016   PLT 204 02/10/2016   Lab Results  Component Value Date   CREATININE 0.89 02/10/2016    Active Problems:   History of abdominal aortic aneurysm (AAA)  Gae Gallop Beeper: [id number] 02/10/2016

## 2016-02-10 NOTE — Care Management Note (Signed)
Case Management Note  Patient Details  Name: [name] [name] MRN: [id number] Date of Birth: 10-09-1940  Subjective/Objective:  S/p AAA repair, lives with wife, has a rolling walker at home.  He has been up ambulating, states he does not need [place] services.  Pt eval was ordered but wife states he does not need it.  He was throwing up early but he is now better per wife.  He is for dc today.  Has PCP, medication coverage and transportation.                    Action/Plan:   Expected Discharge Date:                  Expected Discharge Plan:  Home/Self Care  In-House Referral:     Discharge planning Services  CM Consult  Post Acute Care Choice:    Choice offered to:     DME Arranged:    DME Agency:     HH Arranged:    HH Agency:     Status of Service:  Completed, signed off  If discussed at [employer] of Stay Meetings, dates discussed:    Additional Comments:  Zenon Mayo, RN 02/10/2016, 2:14 PM

## 2016-02-10 NOTE — Progress Notes (Signed)
Patient ambulated approximately 33ft around the unit on room air with no assistive devices.  Patient tolerated well, chose to get back into bed and get to chair later in the morning before breakfast. Will continue to monitor.

## 2016-02-10 NOTE — Progress Notes (Signed)
Received order to d/c patient.  PIVs removed.  No more issues with nausea/vomiting after eating lunch.  Pt ambulated and has a walker at home if needed.  Provided pt with discharge information and handout.  Answered all questions.

## 2016-02-10 NOTE — Discharge Summary (Signed)
[facility] Discharge Summary   [name] [name] Sep 24, 1940 75 y.o. male  MRN: [id number]  Admission Date: 02/09/2016  Discharge Date: 02/10/16  Physician: Angelia Mould, MD  Admission Diagnosis: Abdominal aortic aneurysm I71.4   HPI:   This is a 75 y.o. male who I have been following with an abdominal aortic aneurysm. When I last saw him in September, the aneurysm had enlarged to 5.6 cm by ultrasound. Therefore I thought we should consider elective repair given the tentative 10% per year risk of rupture for an aneurysm of that size. He underwent a CT angiogram to evaluate him for endovascular repair and comes in to discuss those results.  He denies any abdominal pain or back pain. He is very active and works out quite a bit.  Hospital Course:  The patient was admitted to the hospital and taken to the operating room on 02/09/2016 and underwent: Percutaneous endovascular repair of abdominal aortic aneurysm using 3 components.    The pt tolerated the procedure well and was transported to the PACU in good condition.   By POD 1, he was doing well.  His creatinine was normal.He did have an acute surgical blood loss anemia but is tolerating and did not require a blood transfusion.  Pt did have some vomiting right after eating breakfast.  He was given SL Zofran.  His nausea resolved.  He was able to eat lunch without difficulty and did not have any further nausea/vomiting.  He was discharged home.  The remainder of the hospital course consisted of increasing mobilization and increasing intake of solids without difficulty.  CBC    Component Value Date/Time   WBC 10.2 02/10/2016 0434   RBC 3.93 (L) 02/10/2016 0434   HGB 12.2 (L) 02/10/2016 0434   HCT 36.8 (L) 02/10/2016 0434   PLT 204 02/10/2016 0434   MCV 93.6 02/10/2016 0434   MCH 31.0 02/10/2016 0434   MCHC 33.2 02/10/2016 0434   RDW 13.7 02/10/2016 0434   LYMPHSABS 0.8 10/11/2008 1058   MONOABS 0.6 10/11/2008 1058   EOSABS 0.0 10/11/2008 1058   BASOSABS 0.0 10/11/2008 1058    BMET    Component Value Date/Time   NA 136 02/10/2016 0434   K 3.8 02/10/2016 0434   CL 105 02/10/2016 0434   CO2 24 02/10/2016 0434   GLUCOSE 117 (H) 02/10/2016 0434   BUN 10 02/10/2016 0434   CREATININE 0.89 02/10/2016 0434   CREATININE 0.93 11/22/2014 0736   CALCIUM 8.2 (L) 02/10/2016 0434   GFRNONAA >60 02/10/2016 0434   GFRAA >60 02/10/2016 0434       Discharge Instructions    ABDOMINAL PROCEDURE/ANEURYSM REPAIR/AORTO-BIFEMORAL BYPASS:  Call MD for increased abdominal pain; cramping diarrhea; nausea/vomiting    Complete by:  As directed    Call MD for:  redness, tenderness, or signs of infection (pain, swelling, bleeding, redness, odor or green/yellow discharge around incision site)    Complete by:  As directed    Call MD for:  severe or increased pain, loss or decreased feeling  in affected limb(s)    Complete by:  As directed    Call MD for:  temperature >100.5    Complete by:  As directed    Discharge wound care:    Complete by:  As directed    Shower daily with soap and water starting 02/11/16   Driving Restrictions    Complete by:  As directed    No driving for 2 weeks   Lifting restrictions  Complete by:  As directed    No lifting for 4 weeks   Resume previous diet    Complete by:  As directed       Discharge Diagnosis:  Abdominal aortic aneurysm I71.4  Secondary Diagnosis: Patient Active Problem List   Diagnosis Date Noted  . History of abdominal aortic aneurysm (AAA) 02/09/2016  . AAA (abdominal aortic aneurysm) without rupture ([place]) 11/19/2012  . Abdominal aneurysm without mention of rupture 05/14/2012   Past Medical History:  Diagnosis Date  . AAA (abdominal aortic aneurysm) ([place])   . Arthritis    "fingers"  (02/09/2016)  . Basal cell carcinoma of cheek 2012  . Chronic low back pain    pt. denies  . DDD (degenerative disc disease), lumbar   . Headache   . History of kidney  stones   . Hyperlipidemia        Medication List    TAKE these medications   acetaminophen 500 MG tablet Commonly known as:  TYLENOL Take 1,000 mg by mouth daily as needed (for pain--alternates between advil and tylenol).   aspirin EC 81 MG tablet Take 1 tablet (81 mg total) by mouth daily.   cholecalciferol 1000 units tablet Commonly known as:  VITAMIN D Take 1,000 Units by mouth daily.   EPIPEN IJ Inject as directed.   Garlic [id number] MG Caps Take 500 mg by mouth daily.   GINKOBA PO Take 60 mg by mouth daily.   glucosamine-chondroitin 500-400 MG tablet Take 2 tablets by mouth daily.   hydroxypropyl methylcellulose / hypromellose 2.5 % ophthalmic solution Commonly known as:  ISOPTO TEARS / GONIOVISC Place 1-2 drops into both eyes 4 (four) times daily as needed for dry eyes.   ibuprofen 200 MG tablet Commonly known as:  ADVIL,MOTRIN Take 400 mg by mouth daily as needed (for pain--alternates between advil and tylenol).   OCUVITE ADULT 50+ Caps Take 1 capsule by mouth daily.   CENTRUM SILVER PO Take 1 tablet by mouth daily.   oxyCODONE-acetaminophen 5-325 MG tablet Commonly known as:  PERCOCET/ROXICET Take 1 tablet by mouth every 6 (six) hours as needed for moderate pain.   simvastatin 20 MG tablet Commonly known as:  ZOCOR Take 20 mg by mouth every evening.       Prescriptions given: Roxicet #10 No Refill  Instructions: 1.  No driving x 2 weeks 2.  No heavy lifting x 4 weeks 3.  Shower daily with soap and water.  Disposition: home  Patient's condition: is Good  Follow up: 1. Dr. Scot Dock in 4 weeks with CTA   Leontine Locket, PA-C [facility] [phone] 02/10/2016  7:34 AM   - For VQI Registry use --- Instructions: Press F2 to tab through selections.  Delete question if not applicable.   Post-op:  Time to Extubation: [x]  In OR, [ ]  < 12 hrs, [ ]  12-24 hrs, [ ]  >=24 hrs Vasopressors Req. Post-op: No MI: No., [ ]  Troponin  only, [ ]  EKG or Clinical New Arrhythmia: No CHF: No ICU Stay: 1 day in stepdown Transfusion: No  If yes, n/a units given  Complications: Resp failure: No., [ ]  Pneumonia, [ ]  Ventilator Chg in renal function: No., [ ]  Inc. Cr > 0.5, [ ]  Temp. Dialysis, [ ]  Permanent dialysis Leg ischemia: No., no Surgery needed, [ ]  Yes, Surgery needed, [ ]  Amputation Bowel ischemia: No., [ ]  Medical Rx, [ ]  Surgical Rx Wound complication: No., [ ]  Superficial separation/infection, [ ]  Return to  OR Return to OR: No  Return to OR for bleeding: No Stroke: No., [ ]  Minor, [ ]  Major  Discharge medications: Statin use:  Yes If No: [ ]  For Medical reasons, [ ]  Non-compliant, [ ]  Not-indicated ASA use:  Yes  If No: [ ]  For Medical reasons, [ ]  Non-compliant, [ ]  Not-indicated Plavix use:  No If No: [ ]  For Medical reasons, [ ]  Non-compliant, [ ]  Not-indicated Beta blocker use:  No If No: [ ]  For Medical reasons, [ ]  Non-compliant, [ ]  Not-indicated

## 2016-02-13 ENCOUNTER — Telehealth: Payer: Self-pay | Admitting: Vascular Surgery

## 2016-02-13 NOTE — Telephone Encounter (Signed)
PA TO SEE - [facility] - 1 m f/u CTA Abd/Pel + MD, as per staff message, lm on cell#, 02/09/16 mar

## 2016-02-13 NOTE — Telephone Encounter (Signed)
-----   Message from Mena Goes, RN sent at 02/10/2016 12:43 PM EST ----- Regarding: CTA and 4 weeks postop [facility]   ----- Message ----- From: Gabriel Earing, PA-C Sent: 02/10/2016   7:33 AM To: [facility] Charge Pool  S/p [facility]AR 02/09/16.  Needs CTA protocol and appointment with Dr Scot Dock in 4 weeks.  Thanks, Aldona Bar

## 2016-02-21 ENCOUNTER — Telehealth: Payer: Self-pay

## 2016-02-21 NOTE — Telephone Encounter (Signed)
rec'd phone call from pt. with c/o discomfort in upper area of left hip with walking.  Reported it occurs at about 1/2 the distance into a 1/4 mi. walk.  Reported he has some shortness of breath with walking.  Stated the shortness of breath eases, when he stops walking.  Denied any chest pain or heaviness in the chest, associated with SOB.  Reported the groin areas look okay, other than some residual bruising.  Denied fever/ chills.  Denied cough.  Advised with discuss with MD in office and call back with recommendations.  Agreed.

## 2016-02-22 NOTE — Telephone Encounter (Signed)
Discussed pt's. symptoms with Dr. Bridgett Larsson.  Advised that  the hip pain is not likely caused by recent stent graft repair.  Recommended to have pt. check for a pulse in left LE; stated that if he has a good pulse in left LE, then the hip pain is definitely not related to the stent graft repair.  Phone call to pt.  Advised of Dr. Lianne Moris comments/ recommendations.  Advised to continue to monitor, and to notify his PCP if the hip pain persists.  Also advised that if SOB continues or worsens, to report to PCP.   Encouraged to contact our office with any other questions/ concerns.  Verb. Understanding.

## 2016-03-15 ENCOUNTER — Encounter: Payer: Self-pay | Admitting: Vascular Surgery

## 2016-03-21 ENCOUNTER — Encounter: Payer: Self-pay | Admitting: Vascular Surgery

## 2016-03-21 ENCOUNTER — Ambulatory Visit (INDEPENDENT_AMBULATORY_CARE_PROVIDER_SITE_OTHER): Payer: Medicare Other | Admitting: Vascular Surgery

## 2016-03-21 ENCOUNTER — Ambulatory Visit
Admit: 2016-03-21 | Discharge: 2016-03-21 | Disposition: A | Discharge status: Home / Self Care | Payer: Medicare Other | Attending: Vascular Surgery | Admitting: Vascular Surgery

## 2016-03-21 VITALS — BP 140/91 | HR 67 | Temp 97.5°F | Resp 18 | Ht 65.0 in | Wt 154.7 lb

## 2016-03-21 DIAGNOSIS — Z95828 Presence of other vascular implants and grafts: Secondary | ICD-10-CM

## 2016-03-21 DIAGNOSIS — Z48812 Encounter for surgical aftercare following surgery on the circulatory system: Secondary | ICD-10-CM

## 2016-03-21 DIAGNOSIS — I714 Abdominal aortic aneurysm, without rupture, unspecified: Secondary | ICD-10-CM

## 2016-03-21 MED ORDER — IOPAMIDOL (ISOVUE-370) INJECTION 76%
75.0000 mL | Freq: Once | INTRAVENOUS | Status: AC | PRN
Start: 2016-03-21 — End: 2016-03-21
  Administered 2016-03-21: 75 mL via INTRAVENOUS

## 2016-03-21 NOTE — Progress Notes (Signed)
Patient name: [name] [name] MRN: [id number] DOB: 12-03-1940 Sex: male  REASON FOR VISIT: Follow up after endovascular aneurysm repair.  HPI: [name] [name] is a 75 y.o. male who had a 5.8 cm infrarenal abdominal aortic aneurysm. He underwent percutaneous endovascular aneurysm repair using 3 components on 02/09/2016. He comes in for a one-month follow up visit. Since he was discharged and doing well and has been gradually resuming his normal activities. He has no specific complaints.  He is not a smoker. He is very active. He is on aspirin and on a statin.  Current Outpatient Prescriptions  Medication Sig Dispense Refill  . acetaminophen (TYLENOL) 500 MG tablet Take 1,000 mg by mouth daily as needed (for pain--alternates between advil and tylenol).    Marland Kitchen aspirin EC 81 MG tablet Take 1 tablet (81 mg total) by mouth daily.    . cholecalciferol (VITAMIN D) 1000 UNITS tablet Take 1,000 Units by mouth daily.    Marland Kitchen EPINEPHrine (EPIPEN IJ) Inject as directed.    . Garlic [id number] MG CAPS Take 500 mg by mouth daily.    . Ginkgo Biloba (GINKOBA PO) Take 60 mg by mouth daily.    Marland Kitchen glucosamine-chondroitin 500-400 MG tablet Take 2 tablets by mouth daily.     . hydroxypropyl methylcellulose / hypromellose (ISOPTO TEARS / GONIOVISC) 2.5 % ophthalmic solution Place 1-2 drops into both eyes 4 (four) times daily as needed for dry eyes.    Marland Kitchen ibuprofen (ADVIL,MOTRIN) 200 MG tablet Take 400 mg by mouth daily as needed (for pain--alternates between advil and tylenol).    . Multiple Vitamins-Minerals (CENTRUM SILVER PO) Take 1 tablet by mouth daily.    . Multiple Vitamins-Minerals (OCUVITE ADULT 50+) CAPS Take 1 capsule by mouth daily.    Marland Kitchen oxyCODONE-acetaminophen (PERCOCET/ROXICET) 5-325 MG tablet Take 1 tablet by mouth every 6 (six) hours as needed for moderate pain. 10 tablet 0  . simvastatin (ZOCOR) 20 MG tablet Take 20 mg by mouth every evening.      No current facility-administered medications for this visit.       REVIEW OF SYSTEMS:  [X]  denotes positive finding, [ ]  denotes negative finding Cardiac  Comments:  Chest pain or chest pressure:    Shortness of breath upon exertion:    Short of breath when lying flat:    Irregular heart rhythm:    Constitutional    Fever or chills:      PHYSICAL EXAM: Vitals:   03/21/16 1424  BP: (!) 140/91  Pulse: 67  Resp: 18  Temp: 97.5 F (36.4 C)  TempSrc: Oral  SpO2: 98%  Weight: 154 lb 11.2 oz (70.2 kg)  Height: 5\' 5"  (1.651 m)    GENERAL: The patient is a well-nourished male, in no acute distress. The vital signs are documented above. CARDIOVASCULAR: There is a regular rate and rhythm. PULMONARY: There is good air exchange bilaterally without wheezing or rales. Abdomen is soft and nontender. His groin sites look fine. He has palpable pedal pulses.  CT ANGIOGRAM ABDOMEN PELVIS: I reviewed the images of his CT angiogram. The graft is in excellent position. The maximum diameter of the aneurysm is 5.6 cm. There appears to be one very small tight 2 endoleak.  MEDICAL ISSUES:   STATUS POST PERCUTANEOUS ENDOVASCULAR ANEURYSM REPAIR: The patient is doing well status post percutaneous endovascular aneurysm repair for a 5.8 cm infrarenal abdominal aortic aneurysm. I think his one-month CT scan looks good and for this reason I think we can simply do  a duplex in 1 year. I'll see him back at that time. He is on aspirin and is on a statin. He is very active and I have encouraged him to resume his normal activities. He knows to call sooner if he has problems.  Deitra Mayo [facility] [place] [phone]

## 2016-03-22 NOTE — Addendum Note (Signed)
Addended by: Lianne Cure A on: 03/22/2016 09:45 AM   Modules accepted: Orders

## 2016-08-30 IMAGING — CR DG CERVICAL SPINE COMPLETE 4+V
6 series · 6 of 6 positions shown · non-contrast
Comparison: None.

CLINICAL DATA: Left side neck pain.  No known injury.

EXAM:
CERVICAL SPINE  4+ VIEWS

[w c-spine lat]
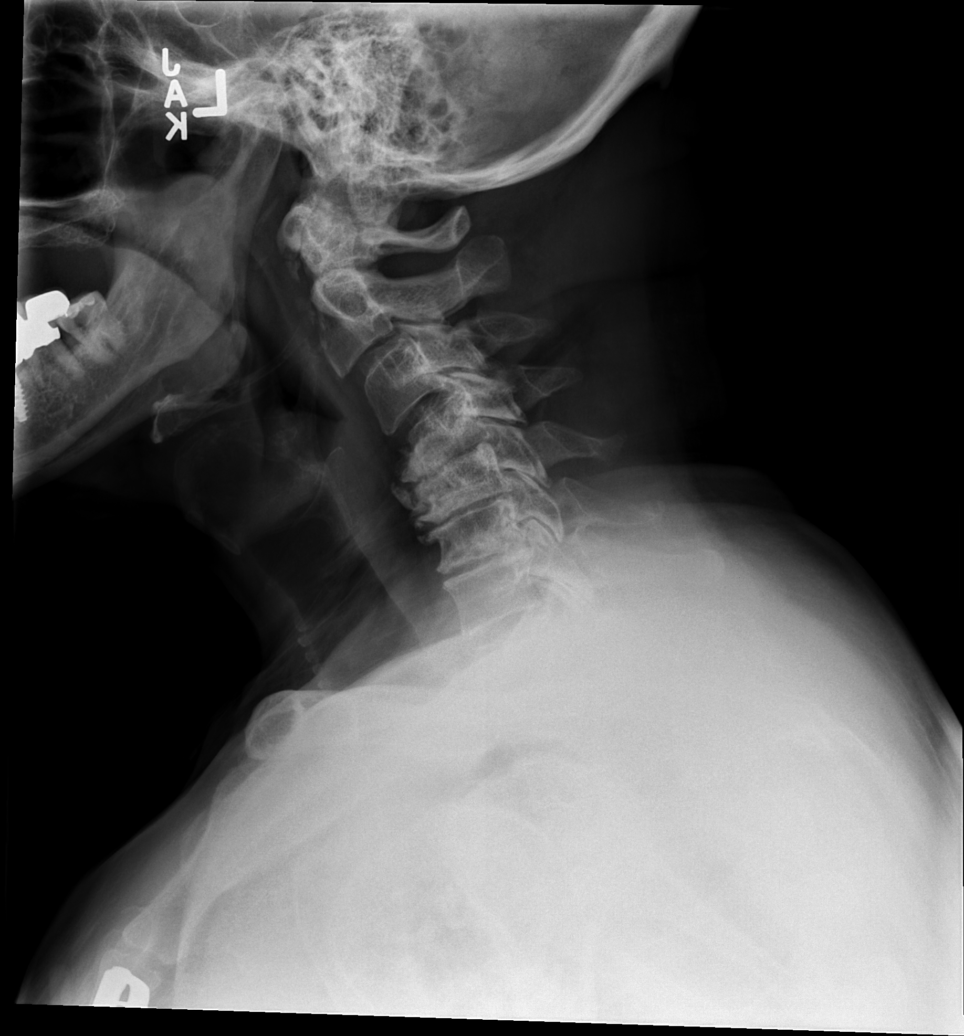

[w c-spine oblique (1 of 2)]
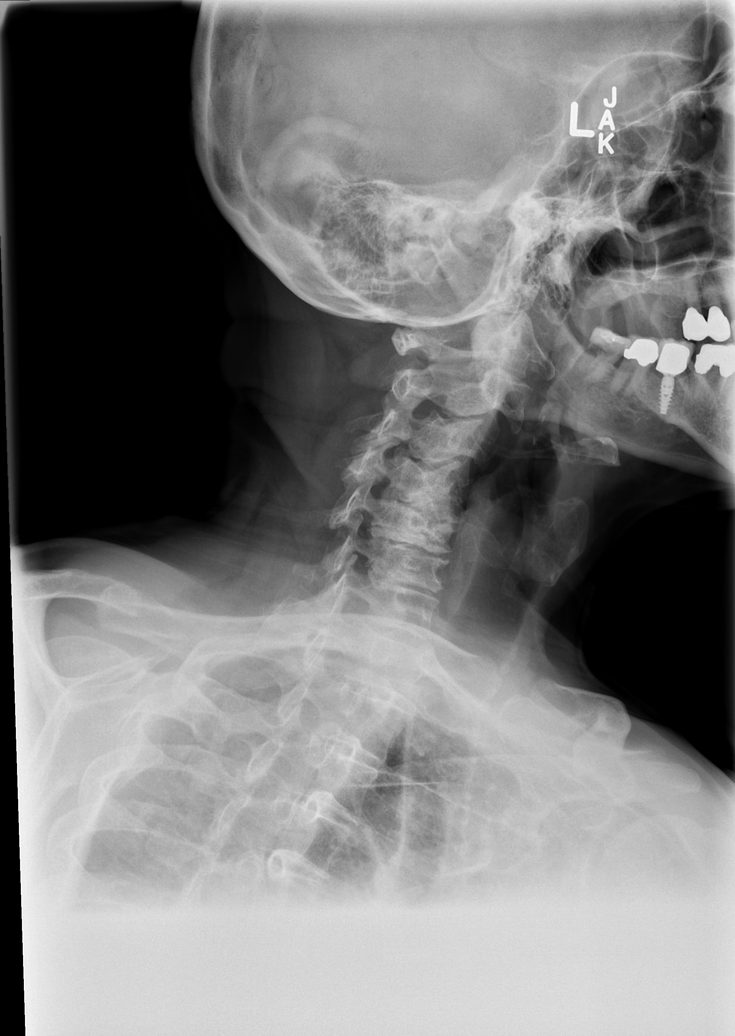

[w c-spine oblique (2 of 2)]
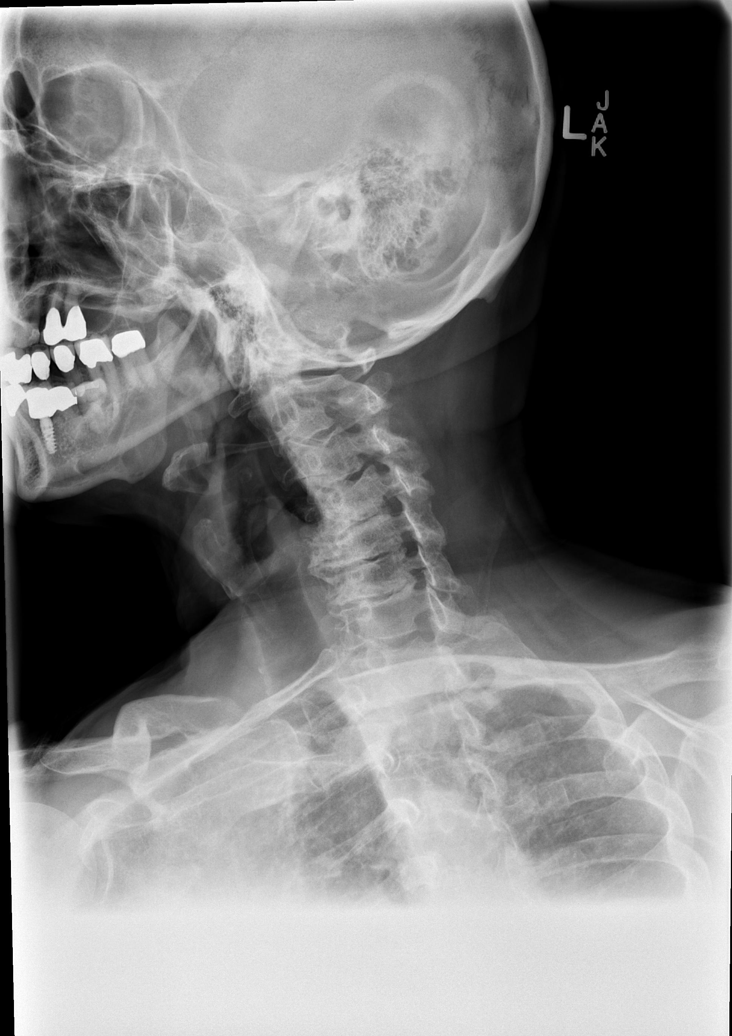

[w c-spine a.p. *]
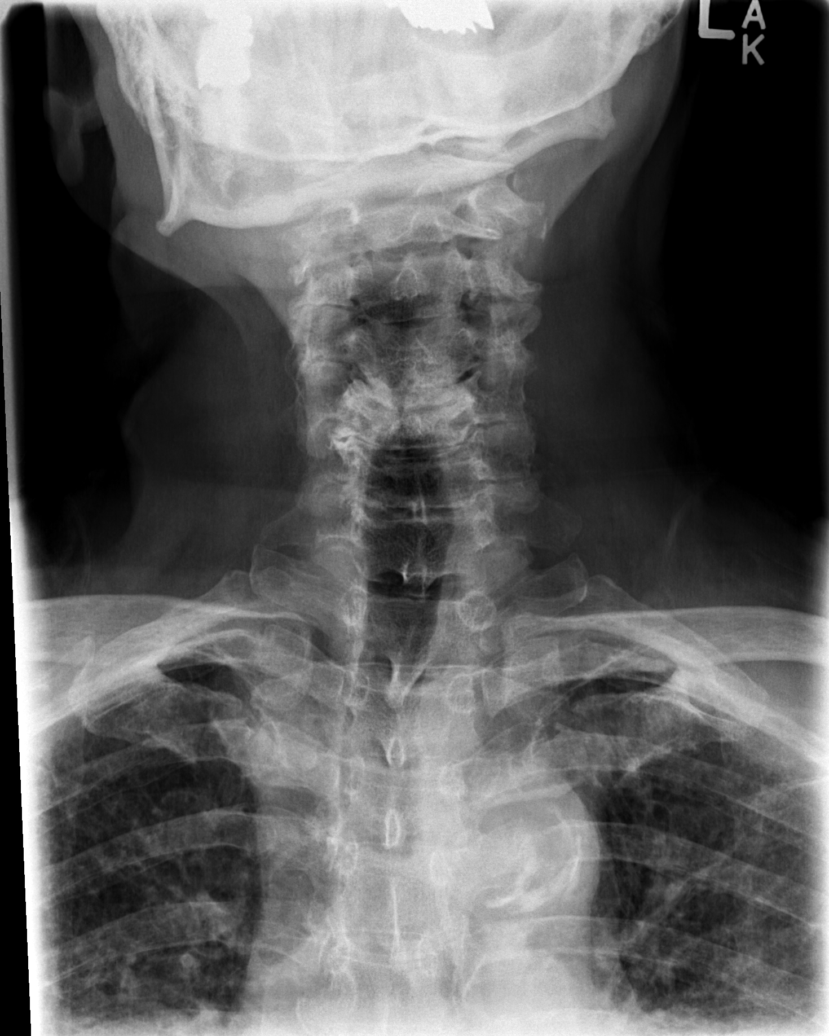

[w c-spine odontoid * (1 of 2)]
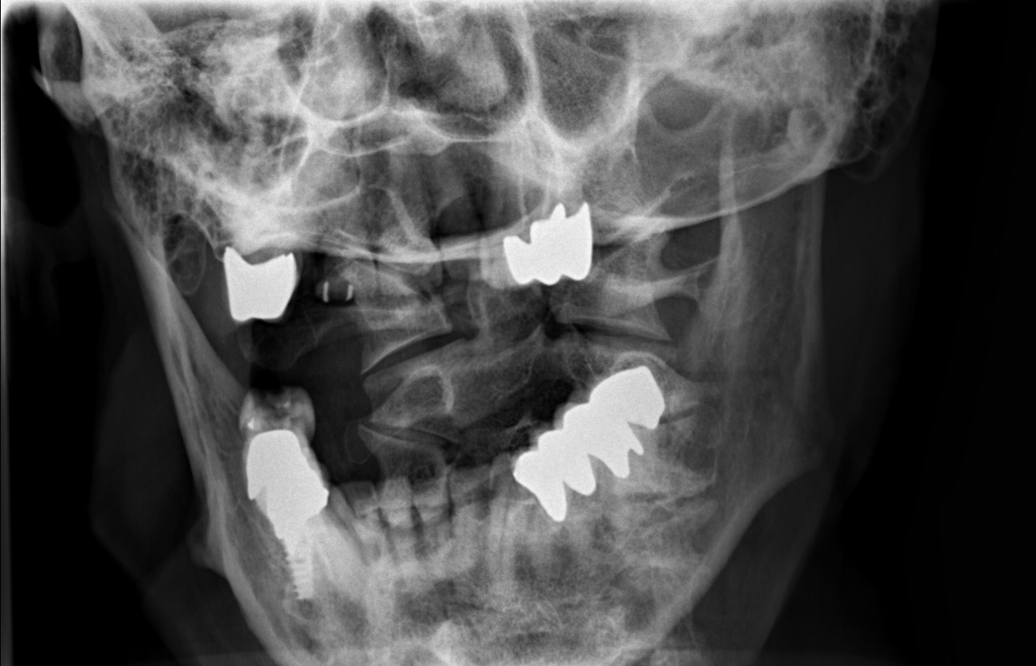

[w c-spine odontoid * (2 of 2)]
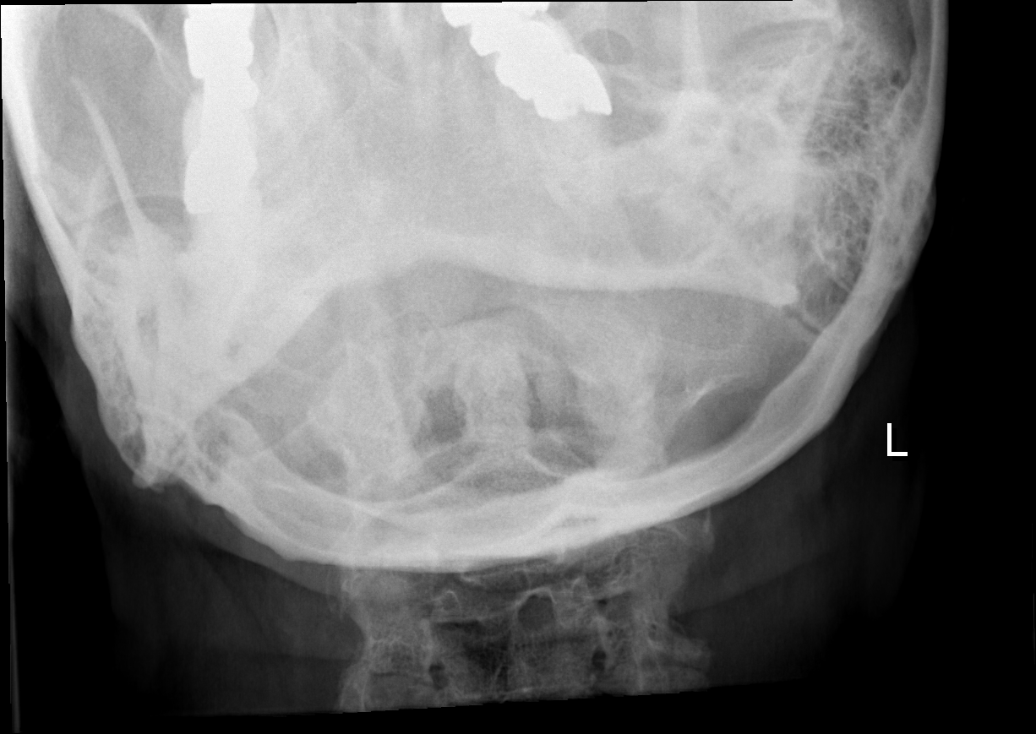

[6 of 6 positions shown; findings below may reference images not displayed]

FINDINGS: Advanced degenerative disc disease from C4-5 thru C6-7. Mild to
moderate bilateral neural foraminal narrowing at at C3-4 through
C6-7, most pronounced on the right at C3-4 and on the left at C3-4
and C6-7. Degenerative facet disease bilaterally. Loss of normal
cervical lordosis. Slight anterolisthesis of C3 on C4 related to
facet disease. Prevertebral soft tissues are normal. No fracture.
IMPRESSION: Degenerative disc and facet disease as above. Multilevel bilateral
neural foraminal narrowing.

## 2017-02-22 ENCOUNTER — Other Ambulatory Visit: Payer: Self-pay | Admitting: Family Medicine

## 2017-02-22 DIAGNOSIS — R911 Solitary pulmonary nodule: Secondary | ICD-10-CM

## 2017-03-04 ENCOUNTER — Other Ambulatory Visit: Payer: Medicare Other

## 2017-03-07 ENCOUNTER — Ambulatory Visit
Admission: RE | Admit: 2017-03-07 | Discharge: 2017-03-07 | Disposition: A | Discharge status: Home / Self Care | Payer: Medicare Other | Source: Ambulatory Visit | Attending: Family Medicine | Admitting: Family Medicine

## 2017-03-07 DIAGNOSIS — R911 Solitary pulmonary nodule: Secondary | ICD-10-CM

## 2017-04-03 ENCOUNTER — Ambulatory Visit (INDEPENDENT_AMBULATORY_CARE_PROVIDER_SITE_OTHER): Payer: PPO | Admitting: Vascular Surgery

## 2017-04-03 ENCOUNTER — Ambulatory Visit (HOSPITAL_COMMUNITY)
Admission: RE | Admit: 2017-04-03 | Discharge: 2017-04-03 | Disposition: A | Discharge status: Home / Self Care | Payer: PPO | Source: Ambulatory Visit | Attending: Vascular Surgery | Admitting: Vascular Surgery

## 2017-04-03 ENCOUNTER — Encounter: Payer: Self-pay | Admitting: Vascular Surgery

## 2017-04-03 VITALS — BP 139/86 | HR 65 | Resp 18 | Ht 65.0 in | Wt 158.8 lb

## 2017-04-03 DIAGNOSIS — Z95828 Presence of other vascular implants and grafts: Secondary | ICD-10-CM | POA: Diagnosis not present

## 2017-04-03 DIAGNOSIS — Z48812 Encounter for surgical aftercare following surgery on the circulatory system: Secondary | ICD-10-CM | POA: Diagnosis not present

## 2017-04-03 DIAGNOSIS — I714 Abdominal aortic aneurysm, without rupture, unspecified: Secondary | ICD-10-CM

## 2017-04-03 NOTE — Progress Notes (Signed)
Patient name: [name] [name] MRN: [id number] DOB: May 07, 1940 Sex: male  REASON FOR VISIT:   Follow-up after endovascular aneurysm repair.  HPI:   [name] [name] is a pleasant 77 y.o. male who underwent repair of a 5.8 cm infrarenal abdominal aortic aneurysm on 02/09/2016.  When he comes in for a yearly follow-up visit.  At the time of his last follow-up visit the aneurysm was 5.5 cm in maximum diameter.  Since I saw him last, he denies any history of  abdominal pain or back pain.  He is not a smoker.  He is on aspirin and is on a statin.  Past Medical History:  Diagnosis Date  . AAA (abdominal aortic aneurysm) ([place])   . Arthritis    "fingers"  (02/09/2016)  . Basal cell carcinoma of cheek 2012  . Chronic low back pain    pt. denies  . DDD (degenerative disc disease), lumbar   . Headache   . History of kidney stones   . Hyperlipidemia     Family History  Problem Relation Age of Onset  . Cancer Mother 69       Ovarian  . Heart disease Father 65       Endocarditis  . Other Brother        plane crash    SOCIAL HISTORY: Social History   Tobacco Use  . Smoking status: Former Smoker    Packs/day: 2.50    Years: 33.00    Pack years: 82.50    Types: Cigarettes    Last attempt to quit: 03/27/1999    Years since quitting: 18.0  . Smokeless tobacco: Never Used  Substance Use Topics  . Alcohol use: Yes    Alcohol/week: 2.4 oz    Types: 2 Glasses of wine, 2 Shots of liquor per week    Allergies  Allergen Reactions  . Bee Venom Hives, Swelling and Rash    Yellow jackets Yellow jackets    Current Outpatient Medications  Medication Sig Dispense Refill  . acetaminophen (TYLENOL) 500 MG tablet Take 1,000 mg by mouth daily as needed (for pain--alternates between advil and tylenol).    Marland Kitchen aspirin EC 81 MG tablet Take 1 tablet (81 mg total) by mouth daily.    . cholecalciferol (VITAMIN D) 1000 UNITS tablet Take 1,000 Units by mouth daily.    Marland Kitchen EPINEPHrine (EPIPEN IJ)  Inject as directed.    . Garlic 644 MG CAPS Take 500 mg by mouth daily.    . Ginkgo Biloba (GINKOBA PO) Take 60 mg by mouth daily.    Marland Kitchen glucosamine-chondroitin 500-400 MG tablet Take 2 tablets by mouth daily.     . hydroxypropyl methylcellulose / hypromellose (ISOPTO TEARS / GONIOVISC) 2.5 % ophthalmic solution Place 1-2 drops into both eyes 4 (four) times daily as needed for dry eyes.    Marland Kitchen ibuprofen (ADVIL,MOTRIN) 200 MG tablet Take 400 mg by mouth daily as needed (for pain--alternates between advil and tylenol).    . Multiple Vitamins-Minerals (CENTRUM SILVER PO) Take 1 tablet by mouth daily.    . Multiple Vitamins-Minerals (OCUVITE ADULT 50+) CAPS Take 1 capsule by mouth daily.    Marland Kitchen oxyCODONE-acetaminophen (PERCOCET/ROXICET) 5-325 MG tablet Take 1 tablet by mouth every 6 (six) hours as needed for moderate pain. 10 tablet 0  . simvastatin (ZOCOR) 20 MG tablet Take 20 mg by mouth every evening.      No current facility-administered medications for this visit.     REVIEW OF SYSTEMS:  [X]   denotes positive finding, [ ]  denotes negative finding Cardiac  Comments:  Chest pain or chest pressure:    Shortness of breath upon exertion:    Short of breath when lying flat:    Irregular heart rhythm:        Vascular    Pain in calf, thigh, or hip brought on by ambulation:    Pain in feet at night that wakes you up from your sleep:     Blood clot in your veins:    Leg swelling:         Pulmonary    Oxygen at home:    Productive cough:     Wheezing:         Neurologic    Sudden weakness in arms or legs:     Sudden numbness in arms or legs:     Sudden onset of difficulty speaking or slurred speech:    Temporary loss of vision in one eye:     Problems with dizziness:         Gastrointestinal    Blood in stool:     Vomited blood:         Genitourinary    Burning when urinating:     Blood in urine:        Psychiatric    Major depression:         Hematologic    Bleeding problems:      Problems with blood clotting too easily:        Skin    Rashes or ulcers:        Constitutional    Fever or chills:     PHYSICAL EXAM:   Vitals:   04/03/17 0832  BP: 139/86  Pulse: 65  Resp: 18  SpO2: 95%  Weight: 158 lb 12.8 oz (72 kg)  Height: 5\' 5"  (1.651 m)    GENERAL: The patient is a well-nourished male, in no acute distress. The vital signs are documented above. CARDIAC: There is a regular rate and rhythm.  VASCULAR: I do not detect carotid bruits. He has palpable femoral pulses. He has no significant lower extremity swelling. PULMONARY: There is good air exchange bilaterally without wheezing or rales. ABDOMEN: Soft and non-tender with normal pitched bowel sounds.  MUSCULOSKELETAL: There are no major deformities or cyanosis. NEUROLOGIC: No focal weakness or paresthesias are detected. SKIN: There are no ulcers or rashes noted. PSYCHIATRIC: The patient has a normal affect.  DATA:    DUPLEX ABDOMINAL AORTA: I have independently interpreted his duplex of the abdominal aorta today.  The maximum diameter of his aorta is 5.5 cm.  This is stable compared to his last study a year ago.  The aneurysm was 5.8 cm prior to repair.  MEDICAL ISSUES:   STATUS POST ENDOVASCULAR ANEURYSM REPAIR: The patient is now over a year out from percutaneous endovascular repair of his 5.8 cm infrarenal abdominal aortic aneurysm.  This is stable in size at 5.5 cm.  I have ordered a follow-up duplex in 1 year and I will see him back at that time.  He knows to call sooner if he has problems.  Deitra Mayo [facility] [phone]

## 2017-05-08 DIAGNOSIS — C44319 Basal cell carcinoma of skin of other parts of face: Secondary | ICD-10-CM | POA: Diagnosis not present

## 2017-05-08 DIAGNOSIS — Z85828 Personal history of other malignant neoplasm of skin: Secondary | ICD-10-CM | POA: Diagnosis not present

## 2017-07-01 DIAGNOSIS — R911 Solitary pulmonary nodule: Secondary | ICD-10-CM | POA: Diagnosis not present

## 2017-07-01 DIAGNOSIS — R6882 Decreased libido: Secondary | ICD-10-CM | POA: Diagnosis not present

## 2017-07-01 DIAGNOSIS — R7301 Impaired fasting glucose: Secondary | ICD-10-CM | POA: Diagnosis not present

## 2017-07-01 DIAGNOSIS — E782 Mixed hyperlipidemia: Secondary | ICD-10-CM | POA: Diagnosis not present

## 2017-07-01 DIAGNOSIS — J301 Allergic rhinitis due to pollen: Secondary | ICD-10-CM | POA: Diagnosis not present

## 2017-07-01 DIAGNOSIS — Z125 Encounter for screening for malignant neoplasm of prostate: Secondary | ICD-10-CM | POA: Diagnosis not present

## 2017-07-01 DIAGNOSIS — Z9889 Other specified postprocedural states: Secondary | ICD-10-CM | POA: Diagnosis not present

## 2017-07-01 DIAGNOSIS — Z0000 Encounter for general adult medical examination without abnormal findings: Secondary | ICD-10-CM | POA: Diagnosis not present

## 2017-09-18 DIAGNOSIS — Z85828 Personal history of other malignant neoplasm of skin: Secondary | ICD-10-CM | POA: Diagnosis not present

## 2017-09-18 DIAGNOSIS — L570 Actinic keratosis: Secondary | ICD-10-CM | POA: Diagnosis not present

## 2017-09-18 DIAGNOSIS — L821 Other seborrheic keratosis: Secondary | ICD-10-CM | POA: Diagnosis not present

## 2017-12-17 DIAGNOSIS — H524 Presbyopia: Secondary | ICD-10-CM | POA: Diagnosis not present

## 2017-12-17 DIAGNOSIS — H5213 Myopia, bilateral: Secondary | ICD-10-CM | POA: Diagnosis not present

## 2017-12-17 DIAGNOSIS — H52203 Unspecified astigmatism, bilateral: Secondary | ICD-10-CM | POA: Diagnosis not present

## 2017-12-17 DIAGNOSIS — H2513 Age-related nuclear cataract, bilateral: Secondary | ICD-10-CM | POA: Diagnosis not present

## 2018-02-26 DIAGNOSIS — M7541 Impingement syndrome of right shoulder: Secondary | ICD-10-CM | POA: Diagnosis not present

## 2018-03-31 DIAGNOSIS — L570 Actinic keratosis: Secondary | ICD-10-CM | POA: Diagnosis not present

## 2018-03-31 DIAGNOSIS — D485 Neoplasm of uncertain behavior of skin: Secondary | ICD-10-CM | POA: Diagnosis not present

## 2018-03-31 DIAGNOSIS — C4401 Basal cell carcinoma of skin of lip: Secondary | ICD-10-CM | POA: Diagnosis not present

## 2018-03-31 DIAGNOSIS — Z85828 Personal history of other malignant neoplasm of skin: Secondary | ICD-10-CM | POA: Diagnosis not present

## 2018-03-31 DIAGNOSIS — L821 Other seborrheic keratosis: Secondary | ICD-10-CM | POA: Diagnosis not present

## 2018-04-11 DIAGNOSIS — M25811 Other specified joint disorders, right shoulder: Secondary | ICD-10-CM | POA: Diagnosis not present

## 2018-04-15 ENCOUNTER — Other Ambulatory Visit: Payer: Self-pay

## 2018-04-15 DIAGNOSIS — I714 Abdominal aortic aneurysm, without rupture, unspecified: Secondary | ICD-10-CM

## 2018-04-16 ENCOUNTER — Encounter: Payer: Self-pay | Admitting: Vascular Surgery

## 2018-04-16 ENCOUNTER — Ambulatory Visit (HOSPITAL_COMMUNITY)
Admission: RE | Admit: 2018-04-16 | Discharge: 2018-04-16 | Disposition: A | Discharge status: Home / Self Care | Payer: PPO | Source: Ambulatory Visit | Attending: Vascular Surgery | Admitting: Vascular Surgery

## 2018-04-16 ENCOUNTER — Ambulatory Visit (INDEPENDENT_AMBULATORY_CARE_PROVIDER_SITE_OTHER): Payer: PPO | Admitting: Vascular Surgery

## 2018-04-16 VITALS — BP 137/82 | HR 67 | Temp 97.8°F | Resp 18 | Ht 65.0 in | Wt 150.0 lb

## 2018-04-16 DIAGNOSIS — I714 Abdominal aortic aneurysm, without rupture, unspecified: Secondary | ICD-10-CM

## 2018-04-16 NOTE — Progress Notes (Signed)
Patient name: [name] [name] MRN: [id number] DOB: February 05, 1941 Sex: male  REASON FOR VISIT:   Follow-up after endovascular repair of aneurysm  HPI:   [name] [name] is a pleasant 78 y.o. male who underwent endovascular repair of a 5.8 cm infrarenal abdominal aortic aneurysm on 02/09/2016.  He comes in for a yearly follow-up visit.  He has no specific complaints and overall is been doing well.  He denies abdominal pain or back pain.  He said no claudication or rest pain.  He is on aspirin and is on a statin.  Past Medical History:  Diagnosis Date  . AAA (abdominal aortic aneurysm) ([place])   . Arthritis    "fingers"  (02/09/2016)  . Basal cell carcinoma of cheek 2012  . Chronic low back pain    pt. denies  . DDD (degenerative disc disease), lumbar   . Headache   . History of kidney stones   . Hyperlipidemia     Family History  Problem Relation Age of Onset  . Cancer Mother 74       Ovarian  . Heart disease Father 48       Endocarditis  . Other Brother        plane crash    SOCIAL HISTORY: Social History   Tobacco Use  . Smoking status: Former Smoker    Packs/day: 2.50    Years: 33.00    Pack years: 82.50    Types: Cigarettes    Last attempt to quit: 03/27/1999    Years since quitting: 19.0  . Smokeless tobacco: Never Used  Substance Use Topics  . Alcohol use: Yes    Alcohol/week: 4.0 standard drinks    Types: 2 Glasses of wine, 2 Shots of liquor per week    Allergies  Allergen Reactions  . Bee Venom Hives, Swelling and Rash    Yellow jackets Yellow jackets    Current Outpatient Medications  Medication Sig Dispense Refill  . acetaminophen (TYLENOL) 500 MG tablet Take 1,000 mg by mouth daily as needed (for pain--alternates between advil and tylenol).    Marland Kitchen aspirin EC 81 MG tablet Take 1 tablet (81 mg total) by mouth daily.    . cholecalciferol (VITAMIN D) 1000 UNITS tablet Take 1,000 Units by mouth daily.    Marland Kitchen EPINEPHrine (EPIPEN IJ) Inject as directed.      . Garlic 929 MG CAPS Take 500 mg by mouth daily.    . Ginkgo Biloba (GINKOBA PO) Take 60 mg by mouth daily.    Marland Kitchen glucosamine-chondroitin 500-400 MG tablet Take 2 tablets by mouth daily.     . hydroxypropyl methylcellulose / hypromellose (ISOPTO TEARS / GONIOVISC) 2.5 % ophthalmic solution Place 1-2 drops into both eyes 4 (four) times daily as needed for dry eyes.    Marland Kitchen ibuprofen (ADVIL,MOTRIN) 200 MG tablet Take 400 mg by mouth daily as needed (for pain--alternates between advil and tylenol).    . Multiple Vitamins-Minerals (CENTRUM SILVER PO) Take 1 tablet by mouth daily.    . Multiple Vitamins-Minerals (OCUVITE ADULT 50+) CAPS Take 1 capsule by mouth daily.    Marland Kitchen oxyCODONE-acetaminophen (PERCOCET/ROXICET) 5-325 MG tablet Take 1 tablet by mouth every 6 (six) hours as needed for moderate pain. 10 tablet 0  . simvastatin (ZOCOR) 20 MG tablet Take 20 mg by mouth every evening.      No current facility-administered medications for this visit.     REVIEW OF SYSTEMS:  [X]  denotes positive finding, [ ]  denotes negative finding Cardiac  Comments:  Chest pain or chest pressure:    Shortness of breath upon exertion:    Short of breath when lying flat:    Irregular heart rhythm:        Vascular    Pain in calf, thigh, or hip brought on by ambulation:    Pain in feet at night that wakes you up from your sleep:     Blood clot in your veins:    Leg swelling:         Pulmonary    Oxygen at home:    Productive cough:     Wheezing:         Neurologic    Sudden weakness in arms or legs:     Sudden numbness in arms or legs:     Sudden onset of difficulty speaking or slurred speech:    Temporary loss of vision in one eye:     Problems with dizziness:         Gastrointestinal    Blood in stool:     Vomited blood:         Genitourinary    Burning when urinating:     Blood in urine:        Psychiatric    Major depression:         Hematologic    Bleeding problems:    Problems with blood  clotting too easily:        Skin    Rashes or ulcers:        Constitutional    Fever or chills:     PHYSICAL EXAM:   Vitals:   04/16/18 0829  BP: 137/82  Pulse: 67  Resp: 18  Temp: 97.8 F (36.6 C)  TempSrc: Oral  SpO2: 95%  Weight: 150 lb (68 kg)  Height: 5\' 5"  (1.651 m)    GENERAL: The patient is a well-nourished male, in no acute distress. The vital signs are documented above. CARDIAC: There is a regular rate and rhythm.  VASCULAR: I do not detect carotid bruits. He has palpable femoral and pedal pulses bilaterally. PULMONARY: There is good air exchange bilaterally without wheezing or rales. ABDOMEN: Soft and non-tender with normal pitched bowel sounds.  MUSCULOSKELETAL: There are no major deformities or cyanosis. NEUROLOGIC: No focal weakness or paresthesias are detected. SKIN: There are no ulcers or rashes noted. PSYCHIATRIC: The patient has a normal affect.  DATA:    DUPLEX ABDOMINAL AORTA: I have independently interpreted his duplex of the abdominal aorta.  The maximum diameter of the aneurysm is 5.6 cm.  Thus this is not changed from 5.5 cm 1 year ago.  There is no evidence of endoleak noted.  MEDICAL ISSUES:   STATUS POST ENDOVASCULAR ANEURYSM REPAIR: Patient is doing well status post endovascular repair of a 5.8 cm infrarenal abdominal aortic aneurysm.  The aneurysm is stable in size at 5.6 cm.  He is on aspirin and is on a statin.  I ordered a follow-up duplex scan in 1 year and I will see him back at that time.  He knows to call sooner if he has problems.  Deitra Mayo [facility] [phone]

## 2018-04-28 DIAGNOSIS — Z85828 Personal history of other malignant neoplasm of skin: Secondary | ICD-10-CM | POA: Diagnosis not present

## 2018-04-28 DIAGNOSIS — C4401 Basal cell carcinoma of skin of lip: Secondary | ICD-10-CM | POA: Diagnosis not present

## 2018-06-09 DIAGNOSIS — M25811 Other specified joint disorders, right shoulder: Secondary | ICD-10-CM | POA: Diagnosis not present

## 2018-07-30 DIAGNOSIS — R7301 Impaired fasting glucose: Secondary | ICD-10-CM | POA: Diagnosis not present

## 2018-07-30 DIAGNOSIS — Z9889 Other specified postprocedural states: Secondary | ICD-10-CM | POA: Diagnosis not present

## 2018-07-30 DIAGNOSIS — E782 Mixed hyperlipidemia: Secondary | ICD-10-CM | POA: Diagnosis not present

## 2018-07-30 DIAGNOSIS — Z0000 Encounter for general adult medical examination without abnormal findings: Secondary | ICD-10-CM | POA: Diagnosis not present

## 2018-07-30 DIAGNOSIS — J301 Allergic rhinitis due to pollen: Secondary | ICD-10-CM | POA: Diagnosis not present

## 2018-09-30 DIAGNOSIS — M25811 Other specified joint disorders, right shoulder: Secondary | ICD-10-CM | POA: Diagnosis not present

## 2018-10-01 DIAGNOSIS — Z85828 Personal history of other malignant neoplasm of skin: Secondary | ICD-10-CM | POA: Diagnosis not present

## 2018-10-01 DIAGNOSIS — L570 Actinic keratosis: Secondary | ICD-10-CM | POA: Diagnosis not present

## 2018-10-01 DIAGNOSIS — L821 Other seborrheic keratosis: Secondary | ICD-10-CM | POA: Diagnosis not present

## 2018-10-01 DIAGNOSIS — D692 Other nonthrombocytopenic purpura: Secondary | ICD-10-CM | POA: Diagnosis not present

## 2018-10-09 ENCOUNTER — Other Ambulatory Visit: Payer: Self-pay | Admitting: Orthopedic Surgery

## 2018-10-09 DIAGNOSIS — M25811 Other specified joint disorders, right shoulder: Secondary | ICD-10-CM

## 2018-10-13 ENCOUNTER — Ambulatory Visit
Admission: RE | Admit: 2018-10-13 | Discharge: 2018-10-13 | Disposition: A | Discharge status: Home / Self Care | Payer: PPO | Source: Ambulatory Visit | Attending: Orthopedic Surgery | Admitting: Orthopedic Surgery

## 2018-10-13 DIAGNOSIS — M62511 Muscle wasting and atrophy, not elsewhere classified, right shoulder: Secondary | ICD-10-CM | POA: Diagnosis not present

## 2018-10-13 DIAGNOSIS — M25811 Other specified joint disorders, right shoulder: Secondary | ICD-10-CM

## 2018-10-13 DIAGNOSIS — M19011 Primary osteoarthritis, right shoulder: Secondary | ICD-10-CM | POA: Diagnosis not present

## 2018-10-20 DIAGNOSIS — M25811 Other specified joint disorders, right shoulder: Secondary | ICD-10-CM | POA: Diagnosis not present

## 2018-11-19 DIAGNOSIS — M25811 Other specified joint disorders, right shoulder: Secondary | ICD-10-CM | POA: Diagnosis not present

## 2018-12-29 NOTE — Pre-Procedure Instructions (Signed)
[facility] [address] [place], [place] [address] Phone: [phone] Fax: [phone]  [facility] [place], [place] [address] [place] [address] Phone: [phone] Fax: [phone]  [facility] [address], [place] [address] [place] [address] Phone: [phone] Fax: [phone]      Your procedure is scheduled on 01-02-19 from 12:45-3:15PM  Report to [facility] Main Entrance "A" at 1045 A.M., and check in at the Admitting office.  Call this number if you have problems the morning of surgery:  [phone]  Call [phone] if you have any questions prior to your surgery date Monday-Friday 8am-4pm    Remember:  Do not eat or drink after midnight the night before your surgery  You may drink clear liquids until 0945AM the morning of your surgery.   Clear liquids allowed are: Water, Non-Citrus Juices (without pulp), Carbonated Beverages, Clear Tea, Black Coffee Only, and Gatorade  Please complete your PRE-SURGERY ENSURE that was provided to you by 0945AM the morning of surgery.  Please, if able, drink it in one setting. DO NOT SIP.   Take these medicines the morning of surgery with A SIP OF WATER: loratadine (CLARITIN)  acetaminophen (TYLENOL) as needed ISOPTO tears as needed  7 days prior to surgery STOP taking any Aspirin (unless otherwise instructed by your surgeon), Aleve, Naproxen, Ibuprofen, Motrin, Advil, Goody's, BC's, all herbal medications, fish oil, and all vitamins.    The Morning of Surgery  Do not wear jewelry, make-up or nail polish.  Do not wear lotions, powders, or perfumes, or deodorant             Men may shave face and neck.  Do not bring valuables to the hospital.  [facility] is not responsible for any belongings or  valuables.  If you are a smoker, DO NOT Smoke 24 hours prior to surgery IF you wear a CPAP at night please bring your mask, tubing, and machine the morning of surgery   Remember that you must have someone to transport you home after your surgery, and remain with you for 24 hours if you are discharged the same day.   Contacts, glasses, hearing aids, dentures or bridgework may not be worn into surgery.    Leave your suitcase in the car.  After surgery it may be brought to your room.  For patients admitted to the hospital, discharge time will be determined by your treatment team.  Patients discharged the day of surgery will not be allowed to drive home.    Special instructions:   [place]- Preparing For Surgery  Before surgery, you can play an important role. Because skin is not sterile, your skin needs to be as free of germs as possible. You can reduce the number of germs on your skin by washing with CHG (chlorahexidine gluconate) Soap before surgery.  CHG is an antiseptic cleaner which kills germs and bonds with the skin to continue killing germs even after washing.    Oral Hygiene is also important to reduce your risk of infection.  Remember - BRUSH YOUR TEETH THE MORNING OF SURGERY WITH YOUR REGULAR TOOTHPASTE  Please do not use if you have an allergy to CHG or antibacterial soaps. If your skin becomes reddened/irritated stop using the CHG.  Do not shave (including legs and underarms) for at least  48 hours prior to first CHG shower. It is OK to shave your face.  Please follow these instructions carefully.   1. Shower the NIGHT BEFORE SURGERY and the MORNING OF SURGERY with CHG Soap.   2. If you chose to wash your hair, wash your hair first as usual with your normal shampoo.  3. After you shampoo, rinse your hair and body thoroughly to remove the shampoo.  4. Use CHG as you would any other liquid soap. You can apply CHG directly to the skin and wash gently with a scrungie or a  clean washcloth.   5. Apply the CHG Soap to your body ONLY FROM THE NECK DOWN.  Do not use on open wounds or open sores. Avoid contact with your eyes, ears, mouth and genitals (private parts). Wash Face and genitals (private parts)  with your normal soap.   6. Wash thoroughly, paying special attention to the area where your surgery will be performed.  7. Thoroughly rinse your body with warm water from the neck down.  8. DO NOT shower/wash with your normal soap after using and rinsing off the CHG Soap.  9. Pat yourself dry with a CLEAN TOWEL.  10. Wear CLEAN PAJAMAS to bed the night before surgery, wear comfortable clothes the morning of surgery  11. Place CLEAN SHEETS on your bed the night of your first shower and DO NOT SLEEP WITH PETS.    Day of Surgery:  Do not apply any deodorants/lotions. Please shower the morning of surgery with the CHG soap  Please wear clean clothes to the hospital/surgery center.   Remember to brush your teeth WITH YOUR REGULAR TOOTHPASTE.   Please read over the following fact sheets that you were given.

## 2018-12-30 ENCOUNTER — Other Ambulatory Visit: Payer: Self-pay

## 2018-12-30 ENCOUNTER — Encounter (HOSPITAL_COMMUNITY): Payer: Self-pay

## 2018-12-30 ENCOUNTER — Encounter (HOSPITAL_COMMUNITY): Payer: Self-pay | Admitting: Physician Assistant

## 2018-12-30 ENCOUNTER — Encounter (HOSPITAL_COMMUNITY)
Admission: RE | Admit: 2018-12-30 | Discharge: 2018-12-30 | Disposition: A | Discharge status: Home / Self Care | Payer: PPO | Source: Ambulatory Visit | Attending: Orthopedic Surgery | Admitting: Orthopedic Surgery

## 2018-12-30 ENCOUNTER — Other Ambulatory Visit (HOSPITAL_COMMUNITY)
Admission: RE | Admit: 2018-12-30 | Discharge: 2018-12-30 | Disposition: A | Discharge status: Home / Self Care | Payer: PPO | Source: Ambulatory Visit | Attending: Orthopedic Surgery | Admitting: Orthopedic Surgery

## 2018-12-30 DIAGNOSIS — Z01818 Encounter for other preprocedural examination: Secondary | ICD-10-CM | POA: Insufficient documentation

## 2018-12-30 DIAGNOSIS — Z20828 Contact with and (suspected) exposure to other viral communicable diseases: Secondary | ICD-10-CM | POA: Insufficient documentation

## 2018-12-30 LAB — CBC
HCT: 47.1 % (ref 39.0–52.0)
Hemoglobin: 15.8 g/dL (ref 13.0–17.0)
MCH: 32.3 pg (ref 26.0–34.0)
MCHC: 33.5 g/dL (ref 30.0–36.0)
MCV: 96.3 fL (ref 80.0–100.0)
Platelets: 261 10*3/uL (ref 150–400)
RBC: 4.89 MIL/uL (ref 4.22–5.81)
RDW: 13.4 % (ref 11.5–15.5)
WBC: 8.5 10*3/uL (ref 4.0–10.5)
nRBC: 0 % (ref 0.0–0.2)

## 2018-12-30 LAB — BASIC METABOLIC PANEL
Anion gap: 11 (ref 5–15)
BUN: 19 mg/dL (ref 8–23)
CO2: 24 mmol/L (ref 22–32)
Calcium: 9.1 mg/dL (ref 8.9–10.3)
Chloride: 104 mmol/L (ref 98–111)
Creatinine, Ser: 1.09 mg/dL (ref 0.61–1.24)
GFR calc Af Amer: >60 mL/min (ref >60)
GFR calc non Af Amer: >60 mL/min (ref >60)
Glucose, Bld: 122 mg/dL — ABNORMAL HIGH (ref 70–99)
Potassium: 4.2 mmol/L (ref 3.5–5.1)
Sodium: 139 mmol/L (ref 135–145)

## 2018-12-30 LAB — SURGICAL PCR SCREEN
MRSA, PCR: NEGATIVE
Staphylococcus aureus: NEGATIVE

## 2018-12-30 NOTE — Progress Notes (Signed)
PCP - Dr. Mayra Neer Cardiologist - denies VVS - Dr. Deitra Mayo: annual visits for follow-up on AAA repair  Chest x-ray - N/A EKG - 12/30/2018 Stress Test - denies ECHO - denies Cardiac Cath - denies  Sleep Study - No CPAP -  N/A  Blood Thinner Instructions: N/A Aspirin Instructions: per patient "stopped taking couple weeks ago"  ERAS Protcol - Yes PRE-SURGERY Ensure -  Given  COVID TEST- Scheduled for 12/30/2018  Anesthesia review: YES, Abnormal EKG  Patient denies shortness of breath, fever, cough and chest pain at PAT appointment  Patient verbalized understanding of instructions that were given to them at the PAT appointment. Patient was also instructed that they will need to review over the PAT instructions again at home before surgery.

## 2018-12-30 NOTE — Pre-Procedure Instructions (Signed)
[facility] [address] [place], [place] [address] Phone: [phone] Fax: [phone]  [facility] [place], [place] [address] [place] [address] Phone: [phone] Fax: [phone]  [facility] [address] [place] - [place] AT [place] [address] Phone: [phone] Fax: [phone]    Your procedure is scheduled on 01-02-19 from 12:45-3:15PM  Report to [facility] Main Entrance "A" at 1045 A.M., and check in at the Admitting office.  Call this number if you have problems the morning of surgery:  [phone]  Call [phone] if you have any questions prior to your surgery date Monday-Friday 8am-4pm   Remember:  Do not eat after midnight the night before your surgery  You may drink clear liquids until 0945AM the morning of your surgery.   Clear liquids allowed are: Water, Non-Citrus Juices (without pulp), Carbonated Beverages, Clear Tea, Black Coffee Only, and Gatorade  Please complete your PRE-SURGERY ENSURE that was provided to you by 0945AM the morning of surgery.  Please, if able, drink it in one setting. DO NOT SIP.   Take these medicines the morning of surgery with A SIP OF WATER: loratadine (CLARITIN)    If needed - acetaminophen (TYLENOL), EPINEPHrine (EPIPEN 2-PAK)/injectin, ISOPTO tears/eye drops,   Follow your surgeon's instructions on when to stop Aspirin.  If no instructions were given by your surgeon then you will need to call the office to get those instructions.    7 days prior to surgery STOP taking any Aspirin (unless otherwise instructed by your surgeon), Aleve, Naproxen, Ibuprofen, Motrin, Advil, Goody's, BC's, all herbal medications, fish oil, and all vitamins.    The Morning of Surgery  Do not wear jewelry, make-up or nail  polish.  Do not wear lotions, powders, or perfumes, or deodorant             Men may shave face and neck.  Do not bring valuables to the hospital.  [facility] is not responsible for any belongings or valuables.  If you are a smoker, DO NOT Smoke 24 hours prior to surgery IF you wear a CPAP at night please bring your mask, tubing, and machine the morning of surgery   Remember that you must have someone to transport you home after your surgery, and remain with you for 24 hours if you are discharged the same day.  Contacts, glasses, hearing aids, dentures or bridgework may not be worn into surgery.   Leave your suitcase in the car.  After surgery it may be brought to your room.  For patients admitted to the hospital, discharge time will be determined by your treatment team.  Patients discharged the day of surgery will not be allowed to drive home.   Special instructions:   [place]- Preparing For Surgery  Before surgery, you can play an important role. Because skin is not sterile, your skin needs to be as free of germs as possible. You can reduce the number of germs on your skin by washing with CHG (chlorahexidine gluconate) Soap before surgery.  CHG is an antiseptic cleaner which kills germs and bonds with the skin to continue killing germs even after washing.    Oral Hygiene is also important to reduce your risk of infection.  Remember - BRUSH YOUR TEETH THE MORNING OF SURGERY WITH YOUR REGULAR TOOTHPASTE  Please do not use  if you have an allergy to CHG or antibacterial soaps. If your skin becomes reddened/irritated stop using the CHG.  Do not shave (including legs and underarms) for at least 48 hours prior to first CHG shower. It is OK to shave your face.  Please follow these instructions carefully.   1. Shower the NIGHT BEFORE SURGERY and the MORNING OF SURGERY with CHG Soap.   2. If you chose to wash your hair, wash your hair first as usual with your normal shampoo.  3. After  you shampoo, rinse your hair and body thoroughly to remove the shampoo.  4. Use CHG as you would any other liquid soap. You can apply CHG directly to the skin and wash gently with a scrungie or a clean washcloth.   5. Apply the CHG Soap to your body ONLY FROM THE NECK DOWN.  Do not use on open wounds or open sores. Avoid contact with your eyes, ears, mouth and genitals (private parts). Wash Face and genitals (private parts)  with your normal soap.   6. Wash thoroughly, paying special attention to the area where your surgery will be performed.  7. Thoroughly rinse your body with warm water from the neck down.  8. DO NOT shower/wash with your normal soap after using and rinsing off the CHG Soap.  9. Pat yourself dry with a CLEAN TOWEL.  10. Wear CLEAN PAJAMAS to bed the night before surgery, wear comfortable clothes the morning of surgery  11. Place CLEAN SHEETS on your bed the night of your first shower and DO NOT SLEEP WITH PETS.    Day of Surgery:  Do not apply any deodorants/lotions. Please shower the morning of surgery with the CHG soap  Please wear clean clothes to the hospital/surgery center.   Remember to brush your teeth WITH YOUR REGULAR TOOTHPASTE.   Please read over the following fact sheets that you were given.

## 2018-12-31 LAB — NOVEL CORONAVIRUS, NAA (HOSP ORDER, SEND-OUT TO REF LAB; TAT 18-24 HRS): SARS-CoV-2, NAA: NOT DETECTED

## 2019-01-01 NOTE — Progress Notes (Signed)
Cardiology Office Note:   Date:  01/02/2019  NAME:  [name] [name]    MRN: [id number] DOB:  [birth year]   PCP:  Mayra Neer, MD  Cardiologist:  Evalina Field, MD  Electrophysiologist:  None   Referring MD: Mayra Neer, MD   Chief Complaint  Patient presents with  . Abnormal ECG   History of Present Illness:   [name] [name] is a 78 y.o. male with a hx of AAA (s/p endovascular repair 02/09/2016 without endoleak), HLD who is being seen today for the evaluation of abnormal EKG at the request of Mayra Neer, MD. He was at surgery center for shoulder surgery and noted to be in ventricular bigeminy. Surgery canceled for cardiology evaluation.  His EKG here demonstrates normal sinus rhythm without PVCs.  He reports he does not notice any extra heartbeats or skipped beats.  He is an extremely active gentleman, working out daily.  He walks 2 miles in the morning, and then has a second session in the afternoons of about 45 minutes of strength training.  He reports no episodes of chest pain or trouble breathing with this.  He did have a AAA repaired and is doing well from this.  He is never had any history of CAD.  He was a heavy smoker in the past but is quit almost 20 years ago.  He only takes an aspirin and cholesterol medication.  He reports he would like to hurry up and get his evaluation completed to get a shoulder surgery done.  His surgeon is Dr. Victorino December at [facility].  Past Medical History: Past Medical History:  Diagnosis Date  . AAA (abdominal aortic aneurysm) ([place])   . Arthritis    "fingers"  (02/09/2016)  . Basal cell carcinoma of cheek 2012  . Chronic low back pain    pt. denies  . DDD (degenerative disc disease), lumbar   . Headache   . History of kidney stones   . Hyperlipidemia     Past Surgical History: Past Surgical History:  Procedure Laterality Date  . ABDOMINAL AORTIC ANEURYSM REPAIR    . ABDOMINAL AORTIC ENDOVASCULAR STENT GRAFT   02/09/2016  . ABDOMINAL AORTIC ENDOVASCULAR STENT GRAFT N/A 02/09/2016   Procedure: ABDOMINAL AORTIC ENDOVASCULAR STENT GRAFT;  Surgeon: Angelia Mould, MD;  Location: [facility];  Service: Vascular;  Laterality: N/A;  . COLONOSCOPY    . MENISCUS REPAIR Right 1960s X 2  . MOHS SURGERY Bilateral 2012   "cheeks"  . TOOTH EXTRACTION  10/2012   "put me out to pull 1 tooth"    Current Medications: No outpatient medications have been marked as taking for the 01/02/19 encounter (Office Visit) with Geralynn Rile, MD.     Allergies:    Bee venom   Social History: Social History   Socioeconomic History  . Marital status: Married    Spouse name: Not on file  . Number of children: 2  . Years of education: Not on file  . Highest education level: Not on file  Occupational History  . Not on file  Social Needs  . Financial resource strain: Not on file  . Food insecurity    Worry: Not on file    Inability: Not on file  . Transportation needs    Medical: Not on file    Non-medical: Not on file  Tobacco Use  . Smoking status: Former Smoker    Packs/day: 2.50    Years: 33.00    Pack years:  82.50    Types: Cigarettes    Quit date: 03/27/1999    Years since quitting: 19.7  . Smokeless tobacco: Never Used  Substance and Sexual Activity  . Alcohol use: Yes    Alcohol/week: 4.0 standard drinks    Types: 2 Glasses of wine, 2 Shots of liquor per week  . Drug use: No  . Sexual activity: Not Currently  Lifestyle  . Physical activity    Days per week: Not on file    Minutes per session: Not on file  . Stress: Not on file  Relationships  . Social Herbalist on phone: Not on file    Gets together: Not on file    Attends religious service: Not on file    Active member of club or organization: Not on file    Attends meetings of clubs or organizations: Not on file    Relationship status: Not on file  Other Topics Concern  . Not on file  Social History Narrative  .  Not on file     Family History: The patient's family history includes Cancer (age of onset: 86) in his mother; Heart disease (age of onset: 88) in his father; Other in his brother.  ROS:   All other ROS reviewed and negative. Pertinent positives noted in the HPI.     EKGs/Labs/Other Studies Reviewed:   The following studies were personally reviewed by me today: Total cholesterol 175, HDL 45, LDL 111, triglycerides 96, A1c 5.8, creatinine 1.09  EKG:  EKG is ordered today.  The ekg ordered today demonstrates normal sinus rhythm, heart rate 75, left atrial abnormality noted, left axis deviation, normal intervals, no acute ST-T changes, no prior infarction, and was personally reviewed by me.   Recent Labs: 12/30/2018: BUN 19; Creatinine, Ser 1.09; Hemoglobin 15.8; Platelets 261; Potassium 4.2; Sodium 139   Recent Lipid Panel No results found for: CHOL, TRIG, HDL, CHOLHDL, VLDL, LDLCALC, LDLDIRECT  Physical Exam:   VS:  BP 134/87   Pulse 75   Temp 98 F (36.7 C)   Ht 5\' 5"  (1.651 m)   Wt 151 lb 12.8 oz (68.9 kg)   SpO2 96%   BMI 25.26 kg/m    Wt Readings from Last 3 Encounters:  01/02/19 151 lb 12.8 oz (68.9 kg)  12/30/18 151 lb 8 oz (68.7 kg)  04/16/18 150 lb (68 kg)    General: Well nourished, well developed, in no acute distress Heart: Atraumatic, normal size  Eyes: PEERLA, EOMI  Neck: Supple, no JVD Endocrine: No thryomegaly Cardiac: 2 out of 6 systolic ejection murmur best heard at the left lower sternal border Lungs: Clear to auscultation bilaterally, no wheezing, rhonchi or rales  Abd: Soft, nontender, no hepatomegaly  Ext: No edema, pulses 2+ Musculoskeletal: No deformities, BUE and BLE strength normal and equal Skin: Warm and dry, no rashes   Neuro: Alert and oriented to person, place, time, and situation, CNII-XII grossly intact, no focal deficits  Psych: Normal mood and affect   ASSESSMENT:   NAME@ is a 78 y.o. male who presents for the following: 1.  Nonspecific abnormal electrocardiogram (ECG) (EKG)   2. Preoperative cardiovascular examination   3. PVC (premature ventricular contraction)   4. AAA (abdominal aortic aneurysm) without rupture ([place])     PLAN:   1. Nonspecific abnormal electrocardiogram (ECG) (EKG) 2. Preoperative cardiovascular examination 3. PVC (premature ventricular contraction) -He was found to be in ventricular bigeminy in preoperative area prior to shoulder surgery.  EKG here demonstrates normal sinus rhythm.  His heart rate was in the 60s when he was in bigeminy, and I suspect that there was just competition of a PVC focus.  He has no symptoms of palpitations or trouble breathing.  He is extremely active.  I think we will just get an echocardiogram to ensure his heart is structurally normal.  If his echo looks okay, he can proceed with surgery.  He does have a faint cardiac murmur, but I do not suspect this will be anything more than possible aortic sclerosis.  Given his activity level of walking up to 2 miles every day, he will need no further cardiac assessment or stress test pending the above echocardiogram prior to surgery.  I will send a note to Dr. Victorino December at [facility] the results of the echocardiogram and our final recommendations.  4. AAA (abdominal aortic aneurysm) without rupture ([place]) -Would recommend to continue aspirin statin -Most recent lipid profile from 06/2017: Total cholesterol 175, HDL 45, LDL 111, triglycerides 96 -We will likely switch him to Crestor next visit  Disposition: Return in about 3 months (around 04/04/2019).  Medication Adjustments/Labs and Tests Ordered: Current medicines are reviewed at length with the patient today.  Concerns regarding medicines are outlined above.  Orders Placed This Encounter  Procedures  . EKG 12-Lead  . ECHOCARDIOGRAM COMPLETE   No orders of the defined types were placed in this encounter.   Patient Instructions  Medication Instructions:  The  current medical regimen is effective;  continue present plan and medications as directed. Please refer to the Current Medication list given to you today. If you need a refill on your cardiac medications before your next appointment, please call your pharmacy.  Testing/Procedures: Echocardiogram - Your physician has requested that you have an echocardiogram. Echocardiography is a painless test that uses sound waves to create images of your heart. It provides your doctor with information about the size and shape of your heart and how well your heart's chambers and valves are working. This procedure takes approximately one hour. There are no restrictions for this procedure. This will be performed at our [facility] location - [address].  Follow-Up: You will need a follow up appointment in 3-4 months.  You may see Evalina Field, MD  We will clear you for your surgery after ECHO.  At [facility], you and your health needs are our priority.  As part of our continuing mission to provide you with exceptional heart care, we have created designated Provider Care Teams.  These Care Teams include your primary Cardiologist (physician) and Advanced Practice Providers (APPs -  Physician Assistants and Nurse Practitioners) who all work together to provide you with the care you need, when you need it.  Thank you for choosing [facility] at [employer], [place] [address], [place]  [address] [place], [place] [address] [phone]  01/02/2019 3:27 PM

## 2019-01-01 NOTE — Progress Notes (Signed)
Anesthesia Chart Review: EKG at PAT reviewed shows new PVC bigeminy. Pt was asymptomatic, no CV complaints. I advised the pt that because it was a new finding it would be discussed with his PCP prior to surgery. This tracing was sent to pt's PCP Dr. Derrill Memo. She reviewed the tracing and responded stating "This is likely not a concern at all but because it is a new finding, I do think he should have cardiology clearance before surgery." Given this, pt will require cardiology clearance prior to anesthesia. Called Dr. Stann Mainland' scheduler to advise.   Wynonia Musty [facility]/Anesthesiology Phone [phone] 01/01/2019 11:06 AM

## 2019-01-02 ENCOUNTER — Encounter (HOSPITAL_COMMUNITY): Admission: RE | Payer: Self-pay | Source: Home / Self Care

## 2019-01-02 ENCOUNTER — Ambulatory Visit (INDEPENDENT_AMBULATORY_CARE_PROVIDER_SITE_OTHER): Payer: PPO | Admitting: Cardiovascular Disease

## 2019-01-02 ENCOUNTER — Inpatient Hospital Stay (HOSPITAL_COMMUNITY): Admission: RE | Admit: 2019-01-02 | Payer: PPO | Source: Home / Self Care | Admitting: Orthopedic Surgery

## 2019-01-02 ENCOUNTER — Other Ambulatory Visit: Payer: Self-pay

## 2019-01-02 ENCOUNTER — Encounter: Payer: Self-pay | Admitting: Cardiovascular Disease

## 2019-01-02 VITALS — BP 134/87 | HR 75 | Temp 98.0°F | Ht 65.0 in | Wt 151.8 lb

## 2019-01-02 DIAGNOSIS — I714 Abdominal aortic aneurysm, without rupture, unspecified: Secondary | ICD-10-CM

## 2019-01-02 DIAGNOSIS — I493 Ventricular premature depolarization: Secondary | ICD-10-CM

## 2019-01-02 DIAGNOSIS — R9431 Abnormal electrocardiogram [ECG] [EKG]: Secondary | ICD-10-CM | POA: Diagnosis not present

## 2019-01-02 DIAGNOSIS — Z01810 Encounter for preprocedural cardiovascular examination: Secondary | ICD-10-CM | POA: Diagnosis not present

## 2019-01-02 SURGERY — ARTHROPLASTY, SHOULDER, TOTAL, REVERSE
Anesthesia: Choice | Site: Shoulder | Laterality: Right

## 2019-01-02 NOTE — Patient Instructions (Addendum)
Medication Instructions:  The current medical regimen is effective;  continue present plan and medications as directed. Please refer to the Current Medication list given to you today. If you need a refill on your cardiac medications before your next appointment, please call your pharmacy.  Testing/Procedures: Echocardiogram - Your physician has requested that you have an echocardiogram. Echocardiography is a painless test that uses sound waves to create images of your heart. It provides your doctor with information about the size and shape of your heart and how well your heart's chambers and valves are working. This procedure takes approximately one hour. There are no restrictions for this procedure. This will be performed at our [facility] location - [address].  Follow-Up: You will need a follow up appointment in 3-4 months.  You may see Evalina Field, MD  We will clear you for your surgery after ECHO.  At [facility], you and your health needs are our priority.  As part of our continuing mission to provide you with exceptional heart care, we have created designated Provider Care Teams.  These Care Teams include your primary Cardiologist (physician) and Advanced Practice Providers (APPs -  Physician Assistants and Nurse Practitioners) who all work together to provide you with the care you need, when you need it.  Thank you for choosing [facility] at [facility]!!

## 2019-01-05 ENCOUNTER — Telehealth: Payer: Self-pay

## 2019-01-05 ENCOUNTER — Encounter: Payer: Self-pay | Admitting: Cardiovascular Disease

## 2019-01-05 ENCOUNTER — Other Ambulatory Visit: Payer: Self-pay

## 2019-01-05 ENCOUNTER — Ambulatory Visit (HOSPITAL_COMMUNITY): Payer: PPO | Attending: Cardiology

## 2019-01-05 ENCOUNTER — Telehealth: Payer: Self-pay | Admitting: Cardiovascular Disease

## 2019-01-05 DIAGNOSIS — I071 Rheumatic tricuspid insufficiency: Secondary | ICD-10-CM | POA: Insufficient documentation

## 2019-01-05 DIAGNOSIS — I493 Ventricular premature depolarization: Secondary | ICD-10-CM | POA: Diagnosis not present

## 2019-01-05 DIAGNOSIS — I714 Abdominal aortic aneurysm, without rupture: Secondary | ICD-10-CM | POA: Diagnosis not present

## 2019-01-05 NOTE — Telephone Encounter (Signed)
Discussed with [name] [name] that echo look good and he is ok to proceed with surgery. We will send notes to Dr. Stann Mainland at [facility].   Evalina Field, MD

## 2019-01-05 NOTE — Progress Notes (Signed)
Sending note to Dr. Stann Mainland that Mr. Seright had a normal echo and can proceed with surgery.   [place] [address], [place]  [address] [place], [place] [address] [phone]  8:35 AM

## 2019-01-05 NOTE — Telephone Encounter (Signed)
Unable to find fax number to send surgical clearance letter to Dr. Stann Mainland office. Nurse walked letter over and gave it to front office for Dr. Stann Mainland to review.

## 2019-01-16 ENCOUNTER — Other Ambulatory Visit (HOSPITAL_COMMUNITY)
Admission: RE | Admit: 2019-01-16 | Discharge: 2019-01-16 | Disposition: A | Discharge status: Home / Self Care | Payer: PPO | Source: Ambulatory Visit | Attending: Orthopedic Surgery | Admitting: Orthopedic Surgery

## 2019-01-16 DIAGNOSIS — Z20828 Contact with and (suspected) exposure to other viral communicable diseases: Secondary | ICD-10-CM | POA: Insufficient documentation

## 2019-01-16 DIAGNOSIS — Z01812 Encounter for preprocedural laboratory examination: Secondary | ICD-10-CM | POA: Diagnosis not present

## 2019-01-17 LAB — NOVEL CORONAVIRUS, NAA (HOSP ORDER, SEND-OUT TO REF LAB; TAT 18-24 HRS): SARS-CoV-2, NAA: NOT DETECTED

## 2019-01-19 ENCOUNTER — Encounter (HOSPITAL_COMMUNITY): Payer: Self-pay | Admitting: *Deleted

## 2019-01-19 ENCOUNTER — Other Ambulatory Visit: Payer: Self-pay

## 2019-01-19 NOTE — Progress Notes (Signed)
Spoke with pt for pre-op call. Pt had a PAT appt on 12/30/18 and had to have cardiac clearance. Clearance noted in Echo report by Dr. Audie Box. Pt states nothing has changed with his medications, allergies, medical or surgical history since he was seen here on  12/30/18.   Pt has his PAT instructions and instead of arrive at 10:45 AM, he needs to arrive at 10:00 AM. He understands the ERAS instructions and has his Pre-Surgery Ensure to drink by 9:30 AM.  Pt had his Covid test done 01/16/19 and it's negative. Pt states he's been quarantine since the test was done and will continue until he comes for surgery.  Inboxed Dr. Stann Mainland for orders.

## 2019-01-19 NOTE — Anesthesia Preprocedure Evaluation (Addendum)
Anesthesia Evaluation  Patient identified by MRN, date of birth, ID band Patient awake    Reviewed: Allergy & Precautions, NPO status , Patient's Chart, lab work & pertinent test results  Airway Mallampati: I  TM Distance: >3 FB Neck ROM: Full    Dental no notable dental hx. (+) Teeth Intact, Dental Advisory Given   Pulmonary neg pulmonary ROS, former smoker,    Pulmonary exam normal breath sounds clear to auscultation       Cardiovascular negative cardio ROS Normal cardiovascular exam Rhythm:Regular Rate:Normal  Known AAA 5.55cm s/p endovascular stent graft 2017 HLD  TTE 12/2018  1. Left ventricular ejection fraction, by visual estimation, is 65 to 70%. The left ventricle has normal function. Normal left ventricular size. There is no left ventricular hypertrophy.  2. Left ventricular diastolic Doppler parameters are consistent with impaired relaxation pattern of LV diastolic filling.  3. Global right ventricle has normal systolic function.The right ventricular size is normal. No increase in right ventricular wall thickness.  4. Left atrial size was normal.  5. Right atrial size was normal.  6. Mild to moderate mitral annular calcification.  7. The mitral valve is normal in structure. No evidence of mitral valve regurgitation. No evidence of mitral stenosis.  8. The tricuspid valve is normal in structure. Tricuspid valve regurgitation is trivial.  9. The aortic valve is trileaflt. Aortic valve regurgitation was not visualized by color flow Doppler. Severe calcification of the non coronary cusp with possible fusion of the non coronary and left coronary cusps. 10. The pulmonic valve was normal in structure. Pulmonic valve regurgitation is mild by color flow Doppler. 11. TR signal is inadequate for assessing pulmonary artery systolic pressure. 12. The inferior vena cava is normal in size with greater than 50% respiratory variability,  suggesting right atrial pressure of 3 mmHg.   Neuro/Psych  Headaches, negative psych ROS   GI/Hepatic negative GI ROS, Neg liver ROS,   Endo/Other  negative endocrine ROS  Renal/GU negative Renal ROS  negative genitourinary   Musculoskeletal  (+) Arthritis ,   Abdominal   Peds  Hematology negative hematology ROS (+)   Anesthesia Other Findings   Reproductive/Obstetrics                           Anesthesia Physical Anesthesia Plan  ASA: III  Anesthesia Plan: General and Regional   Post-op Pain Management:  Regional for Post-op pain   Induction: Intravenous  PONV Risk Score and Plan: 2 and Dexamethasone and Ondansetron  Airway Management Planned: Oral ETT  Additional Equipment:   Intra-op Plan:   Post-operative Plan: Extubation in OR  Informed Consent: I have reviewed the patients History and Physical, chart, labs and discussed the procedure including the risks, benefits and alternatives for the proposed anesthesia with the patient or authorized representative who has indicated his/her understanding and acceptance.     Dental advisory given  Plan Discussed with: CRNA  Anesthesia Plan Comments:        Anesthesia Quick Evaluation

## 2019-01-20 ENCOUNTER — Other Ambulatory Visit: Payer: Self-pay

## 2019-01-20 ENCOUNTER — Inpatient Hospital Stay (HOSPITAL_COMMUNITY): Payer: PPO | Admitting: Anesthesiology

## 2019-01-20 ENCOUNTER — Inpatient Hospital Stay (HOSPITAL_COMMUNITY): Payer: PPO

## 2019-01-20 ENCOUNTER — Encounter (HOSPITAL_COMMUNITY)
Admission: RE | Disposition: A | Discharge status: Home / Self Care | Payer: Self-pay | Source: Ambulatory Visit | Attending: Orthopedic Surgery

## 2019-01-20 ENCOUNTER — Inpatient Hospital Stay (HOSPITAL_COMMUNITY): Payer: PPO | Admitting: Physician Assistant

## 2019-01-20 ENCOUNTER — Encounter (HOSPITAL_COMMUNITY): Payer: Self-pay | Admitting: *Deleted

## 2019-01-20 ENCOUNTER — Inpatient Hospital Stay (HOSPITAL_COMMUNITY)
Admission: RE | Admit: 2019-01-20 | Discharge: 2019-01-21 | DRG: 483 | Disposition: A | Discharge status: Home / Self Care | Payer: PPO | Source: Ambulatory Visit | Attending: Orthopedic Surgery | Admitting: Orthopedic Surgery

## 2019-01-20 DIAGNOSIS — Z91030 Bee allergy status: Secondary | ICD-10-CM | POA: Diagnosis not present

## 2019-01-20 DIAGNOSIS — Z85828 Personal history of other malignant neoplasm of skin: Secondary | ICD-10-CM | POA: Diagnosis not present

## 2019-01-20 DIAGNOSIS — Z471 Aftercare following joint replacement surgery: Secondary | ICD-10-CM | POA: Diagnosis not present

## 2019-01-20 DIAGNOSIS — E785 Hyperlipidemia, unspecified: Secondary | ICD-10-CM | POA: Diagnosis not present

## 2019-01-20 DIAGNOSIS — Z87442 Personal history of urinary calculi: Secondary | ICD-10-CM | POA: Diagnosis not present

## 2019-01-20 DIAGNOSIS — M12811 Other specific arthropathies, not elsewhere classified, right shoulder: Secondary | ICD-10-CM | POA: Diagnosis not present

## 2019-01-20 DIAGNOSIS — Z87891 Personal history of nicotine dependence: Secondary | ICD-10-CM

## 2019-01-20 DIAGNOSIS — G8929 Other chronic pain: Secondary | ICD-10-CM | POA: Diagnosis not present

## 2019-01-20 DIAGNOSIS — M19011 Primary osteoarthritis, right shoulder: Secondary | ICD-10-CM | POA: Diagnosis not present

## 2019-01-20 DIAGNOSIS — M25511 Pain in right shoulder: Secondary | ICD-10-CM | POA: Diagnosis present

## 2019-01-20 DIAGNOSIS — R110 Nausea: Secondary | ICD-10-CM | POA: Diagnosis not present

## 2019-01-20 DIAGNOSIS — Z79899 Other long term (current) drug therapy: Secondary | ICD-10-CM

## 2019-01-20 DIAGNOSIS — Z7982 Long term (current) use of aspirin: Secondary | ICD-10-CM | POA: Diagnosis not present

## 2019-01-20 DIAGNOSIS — Z96611 Presence of right artificial shoulder joint: Principal | ICD-10-CM

## 2019-01-20 DIAGNOSIS — G8918 Other acute postprocedural pain: Secondary | ICD-10-CM | POA: Diagnosis not present

## 2019-01-20 DIAGNOSIS — I714 Abdominal aortic aneurysm, without rupture: Secondary | ICD-10-CM | POA: Diagnosis present

## 2019-01-20 DIAGNOSIS — M5136 Other intervertebral disc degeneration, lumbar region: Secondary | ICD-10-CM | POA: Diagnosis present

## 2019-01-20 DIAGNOSIS — M75101 Unspecified rotator cuff tear or rupture of right shoulder, not specified as traumatic: Principal | ICD-10-CM | POA: Diagnosis present

## 2019-01-20 DIAGNOSIS — Z01812 Encounter for preprocedural laboratory examination: Secondary | ICD-10-CM | POA: Diagnosis not present

## 2019-01-20 HISTORY — PX: REVERSE SHOULDER ARTHROPLASTY: SHX5054

## 2019-01-20 LAB — CBC
HCT: 45.5 % (ref 39.0–52.0)
Hemoglobin: 15.2 g/dL (ref 13.0–17.0)
MCH: 31.7 pg (ref 26.0–34.0)
MCHC: 33.4 g/dL (ref 30.0–36.0)
MCV: 94.8 fL (ref 80.0–100.0)
Platelets: 240 10*3/uL (ref 150–400)
RBC: 4.8 MIL/uL (ref 4.22–5.81)
RDW: 13.1 % (ref 11.5–15.5)
WBC: 8.1 10*3/uL (ref 4.0–10.5)
nRBC: 0 % (ref 0.0–0.2)

## 2019-01-20 LAB — BASIC METABOLIC PANEL
Anion gap: 12 (ref 5–15)
BUN: 14 mg/dL (ref 8–23)
CO2: 21 mmol/L — ABNORMAL LOW (ref 22–32)
Calcium: 9.3 mg/dL (ref 8.9–10.3)
Chloride: 104 mmol/L (ref 98–111)
Creatinine, Ser: 1.35 mg/dL — ABNORMAL HIGH (ref 0.61–1.24)
GFR calc Af Amer: 58 mL/min — ABNORMAL LOW (ref >60)
GFR calc non Af Amer: 50 mL/min — ABNORMAL LOW (ref >60)
Glucose, Bld: 249 mg/dL — ABNORMAL HIGH (ref 70–99)
Potassium: 3.7 mmol/L (ref 3.5–5.1)
Sodium: 137 mmol/L (ref 135–145)

## 2019-01-20 SURGERY — ARTHROPLASTY, SHOULDER, TOTAL, REVERSE
Anesthesia: Regional | Site: Shoulder | Laterality: Right

## 2019-01-20 MED ORDER — FENTANYL CITRATE (PF) 250 MCG/5ML IJ SOLN
INTRAMUSCULAR | Status: AC
  Filled 2019-01-20: qty 5

## 2019-01-20 MED ORDER — LIDOCAINE 2% (20 MG/ML) 5 ML SYRINGE
INTRAMUSCULAR | Status: DC | PRN
  Administered 2019-01-20: 40 mg via INTRAVENOUS

## 2019-01-20 MED ORDER — ONDANSETRON HCL 4 MG/2ML IJ SOLN: 4.0000 mg | Freq: Four times a day (QID) | INTRAMUSCULAR | Status: DC | PRN

## 2019-01-20 MED ORDER — ASPIRIN EC 81 MG PO TBEC
81.0000 mg | DELAYED_RELEASE_TABLET | Freq: Every day | ORAL | Status: DC
  Administered 2019-01-21: 81 mg via ORAL
  Filled 2019-01-20: qty 1

## 2019-01-20 MED ORDER — ROCURONIUM BROMIDE 10 MG/ML (PF) SYRINGE
PREFILLED_SYRINGE | INTRAVENOUS | Status: DC | PRN
  Administered 2019-01-20: 70 mg via INTRAVENOUS

## 2019-01-20 MED ORDER — DEXAMETHASONE SODIUM PHOSPHATE 10 MG/ML IJ SOLN
INTRAMUSCULAR | Status: DC | PRN
  Administered 2019-01-20: 10 mg via INTRAVENOUS

## 2019-01-20 MED ORDER — PROPOFOL 10 MG/ML IV BOLUS
INTRAVENOUS | Status: DC | PRN
  Administered 2019-01-20: 100 mg via INTRAVENOUS

## 2019-01-20 MED ORDER — ONDANSETRON HCL 4 MG/2ML IJ SOLN
INTRAMUSCULAR | Status: DC | PRN
  Administered 2019-01-20: 4 mg via INTRAVENOUS

## 2019-01-20 MED ORDER — BUPIVACAINE-EPINEPHRINE 0.5% -1:200000 IJ SOLN
INTRAMUSCULAR | Status: AC
  Filled 2019-01-20: qty 1

## 2019-01-20 MED ORDER — MENTHOL 3 MG MT LOZG: 1.0000 | LOZENGE | OROMUCOSAL | Status: DC | PRN

## 2019-01-20 MED ORDER — METOCLOPRAMIDE HCL 5 MG/ML IJ SOLN: 5.0000 mg | Freq: Three times a day (TID) | INTRAMUSCULAR | Status: DC | PRN

## 2019-01-20 MED ORDER — BUPIVACAINE HCL (PF) 0.5 % IJ SOLN
INTRAMUSCULAR | Status: DC | PRN
  Administered 2019-01-20: 15 mL via PERINEURAL

## 2019-01-20 MED ORDER — SUGAMMADEX SODIUM 200 MG/2ML IV SOLN
INTRAVENOUS | Status: DC | PRN
  Administered 2019-01-20: 150 mg via INTRAVENOUS

## 2019-01-20 MED ORDER — LACTATED RINGERS IV SOLN
INTRAVENOUS | Status: DC | PRN
  Administered 2019-01-20 (×2): via INTRAVENOUS

## 2019-01-20 MED ORDER — HYDROCODONE-ACETAMINOPHEN 7.5-325 MG PO TABS: 1.0000 | ORAL_TABLET | ORAL | Status: DC | PRN

## 2019-01-20 MED ORDER — NAPROXEN 250 MG PO TABS
250.0000 mg | ORAL_TABLET | Freq: Two times a day (BID) | ORAL | Status: DC
  Administered 2019-01-20 – 2019-01-21 (×2): 250 mg via ORAL
  Filled 2019-01-20 (×2): qty 1

## 2019-01-20 MED ORDER — METOCLOPRAMIDE HCL 5 MG PO TABS
5.0000 mg | ORAL_TABLET | Freq: Three times a day (TID) | ORAL | Status: DC | PRN
  Administered 2019-01-21: 5 mg via ORAL
  Filled 2019-01-20: qty 1

## 2019-01-20 MED ORDER — ONDANSETRON HCL 4 MG PO TABS: 4.0000 mg | ORAL_TABLET | Freq: Four times a day (QID) | ORAL | Status: DC | PRN

## 2019-01-20 MED ORDER — MIDAZOLAM HCL 2 MG/2ML IJ SOLN
2.0000 mg | Freq: Once | INTRAMUSCULAR | Status: AC
  Administered 2019-01-20: 12:00:00 2 mg via INTRAVENOUS

## 2019-01-20 MED ORDER — CEFAZOLIN SODIUM-DEXTROSE 2-4 GM/100ML-% IV SOLN
2.0000 g | INTRAVENOUS | Status: AC
  Administered 2019-01-20: 13:00:00 2 g via INTRAVENOUS

## 2019-01-20 MED ORDER — FENTANYL CITRATE (PF) 250 MCG/5ML IJ SOLN
INTRAMUSCULAR | Status: DC | PRN
  Administered 2019-01-20: 50 ug via INTRAVENOUS

## 2019-01-20 MED ORDER — ACETAMINOPHEN 500 MG PO TABS
ORAL_TABLET | ORAL | Status: AC
  Filled 2019-01-20: qty 2

## 2019-01-20 MED ORDER — TRANEXAMIC ACID-NACL 1000-0.7 MG/100ML-% IV SOLN
1000.0000 mg | INTRAVENOUS | Status: AC
  Administered 2019-01-20: 13:00:00 1000 mg via INTRAVENOUS

## 2019-01-20 MED ORDER — SIMVASTATIN 20 MG PO TABS
40.0000 mg | ORAL_TABLET | Freq: Every evening | ORAL | Status: DC
  Administered 2019-01-20: 40 mg via ORAL
  Filled 2019-01-20: qty 2

## 2019-01-20 MED ORDER — VANCOMYCIN HCL 1000 MG IV SOLR
INTRAVENOUS | Status: AC
  Filled 2019-01-20: qty 1000

## 2019-01-20 MED ORDER — FENTANYL CITRATE (PF) 100 MCG/2ML IJ SOLN
50.0000 ug | Freq: Once | INTRAMUSCULAR | Status: AC
  Administered 2019-01-20: 12:00:00 50 ug via INTRAVENOUS

## 2019-01-20 MED ORDER — MIDAZOLAM HCL 2 MG/2ML IJ SOLN
INTRAMUSCULAR | Status: AC
  Filled 2019-01-20: qty 2

## 2019-01-20 MED ORDER — DOCUSATE SODIUM 100 MG PO CAPS
100.0000 mg | ORAL_CAPSULE | Freq: Two times a day (BID) | ORAL | Status: DC
  Administered 2019-01-20 – 2019-01-21 (×3): 100 mg via ORAL
  Filled 2019-01-20 (×3): qty 1

## 2019-01-20 MED ORDER — BUPIVACAINE-EPINEPHRINE 0.25% -1:200000 IJ SOLN
INTRAMUSCULAR | Status: DC | PRN
  Administered 2019-01-20: 10 mL

## 2019-01-20 MED ORDER — LACTATED RINGERS IV SOLN
Freq: Once | INTRAVENOUS | Status: AC
  Administered 2019-01-20: 11:00:00 via INTRAVENOUS

## 2019-01-20 MED ORDER — FENTANYL CITRATE (PF) 100 MCG/2ML IJ SOLN
INTRAMUSCULAR | Status: AC
  Administered 2019-01-20: 50 ug via INTRAVENOUS
  Filled 2019-01-20: qty 2

## 2019-01-20 MED ORDER — CHLORHEXIDINE GLUCONATE 4 % EX LIQD: 60.0000 mL | Freq: Once | CUTANEOUS | Status: DC

## 2019-01-20 MED ORDER — CEFAZOLIN SODIUM-DEXTROSE 1-4 GM/50ML-% IV SOLN
1.0000 g | Freq: Four times a day (QID) | INTRAVENOUS | Status: AC
  Administered 2019-01-20 – 2019-01-21 (×3): 1 g via INTRAVENOUS
  Filled 2019-01-20 (×2): qty 50

## 2019-01-20 MED ORDER — MORPHINE SULFATE (PF) 2 MG/ML IV SOLN: 0.5000 mg | INTRAVENOUS | Status: DC | PRN

## 2019-01-20 MED ORDER — LORATADINE 10 MG PO TABS
10.0000 mg | ORAL_TABLET | Freq: Every day | ORAL | Status: DC
  Administered 2019-01-20 – 2019-01-21 (×2): 10 mg via ORAL
  Filled 2019-01-20 (×2): qty 1

## 2019-01-20 MED ORDER — SODIUM CHLORIDE 0.9 % IR SOLN
Status: DC | PRN
  Administered 2019-01-20: 1000 mL

## 2019-01-20 MED ORDER — HYDROCODONE-ACETAMINOPHEN 5-325 MG PO TABS
1.0000 | ORAL_TABLET | ORAL | Status: DC | PRN
  Administered 2019-01-20 – 2019-01-21 (×3): 1 via ORAL
  Filled 2019-01-20 (×3): qty 1

## 2019-01-20 MED ORDER — BUPIVACAINE LIPOSOME 1.3 % IJ SUSP
INTRAMUSCULAR | Status: DC | PRN
  Administered 2019-01-20: 10 mL via PERINEURAL

## 2019-01-20 MED ORDER — MIDAZOLAM HCL 2 MG/2ML IJ SOLN
INTRAMUSCULAR | Status: AC
  Administered 2019-01-20: 2 mg via INTRAVENOUS
  Filled 2019-01-20: qty 2

## 2019-01-20 MED ORDER — VANCOMYCIN HCL 1000 MG IV SOLR
INTRAVENOUS | Status: DC | PRN
  Administered 2019-01-20: 1000 mg via TOPICAL

## 2019-01-20 MED ORDER — PROPOFOL 10 MG/ML IV BOLUS
INTRAVENOUS | Status: AC
  Filled 2019-01-20: qty 20

## 2019-01-20 MED ORDER — TRANEXAMIC ACID-NACL 1000-0.7 MG/100ML-% IV SOLN
INTRAVENOUS | Status: AC
  Filled 2019-01-20: qty 100

## 2019-01-20 MED ORDER — ACETAMINOPHEN 500 MG PO TABS
500.0000 mg | ORAL_TABLET | Freq: Four times a day (QID) | ORAL | Status: AC
  Administered 2019-01-20 – 2019-01-21 (×4): 500 mg via ORAL
  Filled 2019-01-20 (×4): qty 1

## 2019-01-20 MED ORDER — ACETAMINOPHEN 500 MG PO TABS: 1000.0000 mg | ORAL_TABLET | Freq: Once | ORAL | Status: DC

## 2019-01-20 MED ORDER — PHENOL 1.4 % MT LIQD: 1.0000 | OROMUCOSAL | Status: DC | PRN

## 2019-01-20 MED ORDER — SODIUM CHLORIDE 0.9 % IV SOLN
INTRAVENOUS | Status: DC | PRN
  Administered 2019-01-20: 15 ug/min via INTRAVENOUS

## 2019-01-20 MED ORDER — GLYCOPYRROLATE 0.2 MG/ML IJ SOLN
INTRAMUSCULAR | Status: DC | PRN
  Administered 2019-01-20: 0.2 mg via INTRAVENOUS

## 2019-01-20 MED ORDER — FENTANYL CITRATE (PF) 100 MCG/2ML IJ SOLN: 25.0000 ug | INTRAMUSCULAR | Status: DC | PRN

## 2019-01-20 MED ORDER — EPHEDRINE SULFATE 50 MG/ML IJ SOLN
INTRAMUSCULAR | Status: DC | PRN
  Administered 2019-01-20: 5 mg via INTRAVENOUS

## 2019-01-20 MED ORDER — ACETAMINOPHEN 325 MG PO TABS: 325.0000 mg | ORAL_TABLET | Freq: Four times a day (QID) | ORAL | Status: DC | PRN

## 2019-01-20 MED ORDER — CEFAZOLIN SODIUM-DEXTROSE 2-4 GM/100ML-% IV SOLN
INTRAVENOUS | Status: AC
  Filled 2019-01-20: qty 100

## 2019-01-20 SURGICAL SUPPLY — 79 items
AID PSTN UNV HD RSTRNT DISP (MISCELLANEOUS) ×1
ALCOHOL 70% 16 OZ (MISCELLANEOUS) ×3 IMPLANT
AUG COMP REV MI TAPER ADAPTER (Joint) ×3 IMPLANT
AUGMENT COMP REV MI TAPR ADPTR (Joint) IMPLANT
BEARING HUMERAL SHLDER 36M STD (Shoulder) IMPLANT
BIT DRILL 2.7 W/STOP DISP (BIT) ×1 IMPLANT
BIT DRILL 2.7MM W/STOP DISP (BIT) ×1
BIT DRILL 5/64X5 DISP (BIT) ×3 IMPLANT
BIT DRILL QUICK REL 1/8 2PK SL (DRILL) IMPLANT
BIT DRILL TWIST 2.7 (BIT) ×1 IMPLANT
BIT DRILL TWIST 2.7MM (BIT) ×1
BLADE SAG 18X100X1.27 (BLADE) ×3 IMPLANT
BRNG HUM STD 36 RVRS SHLDR (Shoulder) ×1 IMPLANT
BSPLAT GLND SM AUG TPR ADPR (Joint) ×1 IMPLANT
CLOSURE WOUND 1/2 X4 (GAUZE/BANDAGES/DRESSINGS) ×1
COVER SURGICAL LIGHT HANDLE (MISCELLANEOUS) ×3 IMPLANT
COVER WAND RF STERILE (DRAPES) ×1 IMPLANT
DRAPE IMP U-DRAPE 54X76 (DRAPES) ×6 IMPLANT
DRAPE INCISE IOBAN 66X45 STRL (DRAPES) ×3 IMPLANT
DRAPE ORTHO SPLIT 77X108 STRL (DRAPES) ×6
DRAPE SURG 17X23 STRL (DRAPES) ×3 IMPLANT
DRAPE SURG ORHT 6 SPLT 77X108 (DRAPES) ×2 IMPLANT
DRAPE U-SHAPE 47X51 STRL (DRAPES) ×3 IMPLANT
DRILL QUICK RELEASE 1/8 INCH (DRILL) ×2
DRSG AQUACEL AG ADV 3.5X10 (GAUZE/BANDAGES/DRESSINGS) ×3 IMPLANT
DURAPREP 26ML APPLICATOR (WOUND CARE) ×3 IMPLANT
ELECT BLADE 4.0 EZ CLEAN MEGAD (MISCELLANEOUS) ×3
ELECT REM PT RETURN 9FT ADLT (ELECTROSURGICAL) ×3
ELECTRODE BLDE 4.0 EZ CLN MEGD (MISCELLANEOUS) ×1 IMPLANT
ELECTRODE REM PT RTRN 9FT ADLT (ELECTROSURGICAL) ×1 IMPLANT
GLENOID SPHERE 36MM CVD +3 (Orthopedic Implant) ×2 IMPLANT
GLOVE BIO SURGEON STRL SZ7.5 (GLOVE) ×5 IMPLANT
GLOVE BIOGEL PI IND STRL 8 (GLOVE) ×1 IMPLANT
GLOVE BIOGEL PI INDICATOR 8 (GLOVE) ×2
GOWN STRL REUS W/ TWL LRG LVL3 (GOWN DISPOSABLE) ×1 IMPLANT
GOWN STRL REUS W/ TWL XL LVL3 (GOWN DISPOSABLE) ×1 IMPLANT
GOWN STRL REUS W/TWL LRG LVL3 (GOWN DISPOSABLE) ×3
GOWN STRL REUS W/TWL XL LVL3 (GOWN DISPOSABLE) ×6
KIT BASIN OR (CUSTOM PROCEDURE TRAY) ×3 IMPLANT
KIT TURNOVER KIT B (KITS) ×3 IMPLANT
MANIFOLD NEPTUNE II (INSTRUMENTS) ×3 IMPLANT
NDL 1/2 CIR MAYO (NEEDLE) ×1 IMPLANT
NDL HYPO 25GX1X1/2 BEV (NEEDLE) ×1 IMPLANT
NEEDLE 1/2 CIR MAYO (NEEDLE) ×3 IMPLANT
NEEDLE HYPO 25GX1X1/2 BEV (NEEDLE) ×3 IMPLANT
NS IRRIG 1000ML POUR BTL (IV SOLUTION) ×3 IMPLANT
PACK SHOULDER (CUSTOM PROCEDURE TRAY) ×3 IMPLANT
PAD ARMBOARD 7.5X6 YLW CONV (MISCELLANEOUS) ×6 IMPLANT
PIN THREADED REVERSE (PIN) ×2 IMPLANT
REAMER GUIDE BUSHING SURG DISP (MISCELLANEOUS) ×2 IMPLANT
REAMER GUIDE W/SCREW AUG (MISCELLANEOUS) ×2 IMPLANT
RESTRAINT HEAD UNIVERSAL NS (MISCELLANEOUS) ×3 IMPLANT
SCREW BONE STRL 6.5MMX30MM (Screw) ×2 IMPLANT
SCREW LOCKING 4.75MMX15MM (Screw) ×4 IMPLANT
SCREW LOCKING NS 4.75MMX20MM (Screw) ×4 IMPLANT
SHOULDER HUMERAL BEAR 36M STD (Shoulder) ×3 IMPLANT
SLING ARM IMMOBILIZER LRG (SOFTGOODS) ×2 IMPLANT
SLING ARM IMMOBILIZER MED (SOFTGOODS) IMPLANT
SPONGE LAP 18X18 RF (DISPOSABLE) IMPLANT
SPONGE LAP 4X18 RFD (DISPOSABLE) ×3 IMPLANT
STEM HUMERAL STRL 11MMX83MM (Stem) ×2 IMPLANT
STRIP CLOSURE SKIN 1/2X4 (GAUZE/BANDAGES/DRESSINGS) ×2 IMPLANT
SUCTION FRAZIER HANDLE 10FR (MISCELLANEOUS) ×2
SUCTION TUBE FRAZIER 10FR DISP (MISCELLANEOUS) ×1 IMPLANT
SUT BROADBAND TAPE 2PK 2.3 (SUTURE) ×2 IMPLANT
SUT FIBERWIRE #2 38 T-5 BLUE (SUTURE) ×6
SUT MNCRL AB 4-0 PS2 18 (SUTURE) ×3 IMPLANT
SUT VIC AB 1 CT1 27 (SUTURE)
SUT VIC AB 1 CT1 27XBRD ANBCTR (SUTURE) IMPLANT
SUT VIC AB 2-0 CT1 27 (SUTURE) ×6
SUT VIC AB 2-0 CT1 TAPERPNT 27 (SUTURE) ×1 IMPLANT
SUTURE FIBERWR #2 38 T-5 BLUE (SUTURE) ×1 IMPLANT
SYR CONTROL 10ML LL (SYRINGE) ×3 IMPLANT
TOWEL GREEN STERILE (TOWEL DISPOSABLE) ×3 IMPLANT
TOWEL GREEN STERILE FF (TOWEL DISPOSABLE) ×3 IMPLANT
TOWER CARTRIDGE SMART MIX (DISPOSABLE) IMPLANT
TRAY HUM MINI SHOULDER +0 40D (Shoulder) ×2 IMPLANT
WATER STERILE IRR 1000ML POUR (IV SOLUTION) ×3 IMPLANT
YANKAUER SUCT BULB TIP NO VENT (SUCTIONS) ×3 IMPLANT

## 2019-01-20 NOTE — Transfer of Care (Signed)
Immediate Anesthesia Transfer of Care Note  Patient: [name] [name]  Procedure(s) Performed: REVERSE SHOULDER ARTHROPLASTY (Right Shoulder)  Patient Location: PACU  Anesthesia Type:General and Regional  Level of Consciousness: awake, alert  and oriented  Airway & Oxygen Therapy: Patient Spontanous Breathing and Patient connected to nasal cannula oxygen  Post-op Assessment: Report given to RN and Post -op Vital signs reviewed and stable  Post vital signs: Reviewed and stable  Last Vitals:  Vitals Value Taken Time  BP 134/75 01/20/19 1431  Temp    Pulse 58 01/20/19 1431  Resp 15 01/20/19 1431  SpO2 100 % 01/20/19 1431  Vitals shown include unvalidated device data.  Last Pain:  Vitals:   01/20/19 1050  TempSrc:   PainSc: 4       Patients Stated Pain Goal: 3 ([id number])  Complications: No apparent anesthesia complications

## 2019-01-20 NOTE — Brief Op Note (Signed)
01/20/2019  2:14 PM  PATIENT:  [name] [name]  78 y.o. male  PRE-OPERATIVE DIAGNOSIS:  Right shoulder arthropathy  POST-OPERATIVE DIAGNOSIS:  Right shoulder arthropathy  PROCEDURE:  Procedure(s) with comments: REVERSE SHOULDER ARTHROPLASTY (Right) - 2.5 hrs  SURGEON:  Surgeon(s) and Role:    * Nicholes Stairs, MD - Primary  PHYSICIAN ASSISTANT:   ASSISTANTS: April Green, RNFA   ANESTHESIA:   regional and general  EBL:  100 mL   BLOOD ADMINISTERED:none  DRAINS: none   LOCAL MEDICATIONS USED:  MARCAINE     SPECIMEN:  No Specimen  DISPOSITION OF SPECIMEN:  N/A  COUNTS:  YES  TOURNIQUET:  * No tourniquets in log *  DICTATION: .Note written in EPIC  PLAN OF CARE: Admit to inpatient   PATIENT DISPOSITION:  PACU - hemodynamically stable.   Delay start of Pharmacological VTE agent (>24hrs) due to surgical blood loss or risk of bleeding: not applicable

## 2019-01-20 NOTE — Anesthesia Procedure Notes (Signed)
Procedure Name: Intubation Date/Time: 01/20/2019 12:30 PM Performed by: Clearnce Sorrel, CRNA Pre-anesthesia Checklist: Patient identified, Emergency Drugs available, Suction available, Patient being monitored and Timeout performed Patient Re-evaluated:Patient Re-evaluated prior to induction Oxygen Delivery Method: Circle system utilized Preoxygenation: Pre-oxygenation with 100% oxygen Induction Type: IV induction Ventilation: Mask ventilation without difficulty Laryngoscope Size: Mac and 3 Grade View: Grade I Tube type: Oral Tube size: 7.5 mm Number of attempts: 1 Airway Equipment and Method: Stylet Placement Confirmation: ETT inserted through vocal cords under direct vision,  positive ETCO2 and breath sounds checked- equal and bilateral Secured at: 23 cm Tube secured with: Tape Dental Injury: Teeth and Oropharynx as per pre-operative assessment

## 2019-01-20 NOTE — Anesthesia Procedure Notes (Signed)
Anesthesia Regional Block: Interscalene brachial plexus block   Pre-Anesthetic Checklist: ,, timeout performed, Correct Patient, Correct Site, Correct Laterality, Correct Procedure, Correct Position, site marked, Risks and benefits discussed,  Surgical consent,  Pre-op evaluation,  At surgeon's request and post-op pain management  Laterality: Right  Prep: Maximum Sterile Barrier Precautions used, chloraprep       Needles:  Injection technique: Single-shot  Needle Type: Echogenic Stimulator Needle     Needle Length: 4cm  Needle Gauge: 22     Additional Needles:   Procedures:,,,, ultrasound used (permanent image in chart),,,,  Narrative:  Start time: 01/20/2019 12:00 PM End time: 01/20/2019 12:10 PM Injection made incrementally with aspirations every 5 mL.  Performed by: Personally  Anesthesiologist: Freddrick March, MD  Additional Notes: Monitors applied. No increased pain on injection. No increased resistance to injection. Injection made in 5cc increments. Good needle visualization. Patient tolerated procedure well.

## 2019-01-20 NOTE — H&P (Signed)
ORTHOPAEDIC H and P  REQUESTING PHYSICIAN: [name] Stairs, MD  PCP:  [name] Neer, MD  Chief Complaint:  Right shoulder pain  HPI: [name] [name] is a 78 y.o. male who complains of [name] [name] is here today for right reverse shoulder arthroplasty.  He has had extensive conservative treatment and has failed to improve high-level satisfactory to his wishes.  We have discussed the above procedure and the associate risk and benefits at length in the office.  He has had no interval changes in his desire to proceed.  He did have EKG findings that were concerning at his initia l preoperative evaluation.  He has since been evaluated by a cardiologist and found to be without any significant changes and has been cleared for proceeding with today's surgery.  Past Medical History:  Diagnosis Date  . AAA (abdominal aortic aneurysm) ([place])   . Arthritis    "fingers"  (02/09/2016)  . Basal cell carcinoma of cheek 2012  . Chronic low back pain    pt. denies  . DDD (degenerative disc disease), lumbar   . Headache   . History of kidney stones   . Hyperlipidemia    Past Surgical History:  Procedure Laterality Date  . ABDOMINAL AORTIC ANEURYSM REPAIR    . ABDOMINAL AORTIC ENDOVASCULAR STENT GRAFT  02/09/2016  . ABDOMINAL AORTIC ENDOVASCULAR STENT GRAFT N/A 02/09/2016   Procedure: ABDOMINAL AORTIC ENDOVASCULAR STENT GRAFT;  Surgeon: Angelia Mould, MD;  Location: [facility];  Service: Vascular;  Laterality: N/A;  . COLONOSCOPY    . MENISCUS REPAIR Right 1960s X 2  . MOHS SURGERY Bilateral 2012   "cheeks"  . TOOTH EXTRACTION  10/2012   "put me out to pull 1 tooth"   Social History   Socioeconomic History  . Marital status: Married    Spouse name: Not on file  . Number of children: 2  . Years of education: Not on file  . Highest education level: Not on file  Occupational History  . Not on file  Social Needs  . Financial resource strain: Not on file  . Food insecurity    Worry:  Not on file    Inability: Not on file  . Transportation needs    Medical: Not on file    Non-medical: Not on file  Tobacco Use  . Smoking status: Former Smoker    Packs/day: 2.50    Years: 33.00    Pack years: 82.50    Types: Cigarettes    Quit date: 03/27/1999    Years since quitting: 19.8  . Smokeless tobacco: Never Used  Substance and Sexual Activity  . Alcohol use: Yes    Alcohol/week: 4.0 standard drinks    Types: 2 Glasses of wine, 2 Shots of liquor per week  . Drug use: No  . Sexual activity: Not Currently  Lifestyle  . Physical activity    Days per week: Not on file    Minutes per session: Not on file  . Stress: Not on file  Relationships  . Social Herbalist on phone: Not on file    Gets together: Not on file    Attends religious service: Not on file    Active member of club or organization: Not on file    Attends meetings of clubs or organizations: Not on file    Relationship status: Not on file  Other Topics Concern  . Not on file  Social History Narrative  . Not on file  Family History  Problem Relation Age of Onset  . Cancer Mother 52       Ovarian  . Heart disease Father 67       Endocarditis  . Other Brother        plane crash   Allergies  Allergen Reactions  . Bee Venom Hives, Swelling and Rash    Yellow jackets    Prior to Admission medications   Medication Sig Start Date End Date Taking? Authorizing Provider  acetaminophen (TYLENOL) 500 MG tablet Take 1,000 mg by mouth every 6 (six) hours as needed for moderate pain.    Yes [provider]  cholecalciferol (VITAMIN D) 1000 UNITS tablet Take 1,000 Units by mouth daily.   Yes [provider]  Garlic XX123456 MG CAPS Take 500 mg by mouth daily.   Yes [provider]  GLUCOSAMINE-CHONDROITIN PO Take 1,200 mg by mouth daily.   Yes [provider]  hydroxypropyl methylcellulose / hypromellose (ISOPTO TEARS / GONIOVISC) 2.5 % ophthalmic solution Place 1 drop  into both eyes 3 (three) times daily as needed for dry eyes.    Yes [provider]  ibuprofen (ADVIL,MOTRIN) 200 MG tablet Take 400 mg by mouth daily as needed for headache or moderate pain.    Yes [provider]  loratadine (CLARITIN) 10 MG tablet Take 10 mg by mouth daily.   Yes [provider]  Multiple Vitamins-Minerals (CENTRUM SILVER PO) Take 1 tablet by mouth daily.   Yes [provider]  Multiple Vitamins-Minerals (OCUVITE ADULT 50+) CAPS Take 1 capsule by mouth daily.   Yes [provider]  simvastatin (ZOCOR) 40 MG tablet Take 40 mg by mouth every evening.    Yes [provider]  aspirin EC 81 MG tablet Take 1 tablet (81 mg total) by mouth daily. Patient not taking: Reported on 12/24/2018 02/10/16   Gabriel Earing, PA-C  EPINEPHrine (EPIPEN 2-PAK) 0.3 mg/0.3 mL IJ SOAJ injection Inject 0.3 mg into the muscle as needed for anaphylaxis.    [provider]  oxyCODONE-acetaminophen (PERCOCET/ROXICET) 5-325 MG tablet Take 1 tablet by mouth every 6 (six) hours as needed for moderate pain. Patient not taking: Reported on 12/24/2018 02/10/16   Gabriel Earing, PA-C   No results found.  Positive ROS: All other systems have been reviewed and were otherwise negative with the exception of those mentioned in the HPI and as above.  Physical Exam: General: Alert, no acute distress Cardiovascular: No pedal edema Respiratory: No cyanosis, no use of accessory musculature GI: No organomegaly, abdomen is soft and non-tender Skin: No lesions in the area of chief complaint Neurologic: Sensation intact distally Psychiatric: Patient is competent for consent with normal mood and affect Lymphatic: No axillary or cervical lymphadenopathy  MUSCULOSKELETAL:  Right upper extremity: No open wounds.  Neurovascular intact.  Assessment: Right shoulder rotator cuff arthropathy  Plan: - Our plan is for reverse shoulder arthroplasty of the  right shoulder.  We have discussed this at length.  All questions were solicited and answered to his satisfaction.  - He will be admitted to the orthopedics floor postoperatively for routine postoperative care.  - The risks, benefits, and alternatives were discussed with the patient. There are risks associated with the surgery including, but not limited to, problems with anesthesia (death), infection, differences in leg length/angulation/rotation, fracture of bones, loosening or failure of implants, malunion, nonunion, hematoma (blood accumulation) which may require surgical drainage, blood clots, pulmonary embolism, nerve injury, and blood vessel injury.  The patient understands these risks and elects to proceed.     [name] Stairs, MD Cell [phone]    01/20/2019 11:20 AM

## 2019-01-20 NOTE — Discharge Instructions (Signed)
Orthopedic discharge instruction  -Maintain sling at all times to right arm unless doing activities of daily living or exercise routine.  -Maintain your postoperative dressing until your follow-up appointment.  You may shower with this in place but do not submerge underwater.  -Apply ice to the right shoulder for 20 to 30 minutes out of each hour that you are awake.  -For the prevention of blood clots take an 81 mg aspirin once per day for the next 6 weeks.  -Return to see Dr. Stann Mainland in 2 weeks for routine postoperative wound check.

## 2019-01-20 NOTE — Op Note (Signed)
01/20/2019  2:16 PM  PATIENT:  [name] [name]    PRE-OPERATIVE DIAGNOSIS:  Right shoulder arthropathy  POST-OPERATIVE DIAGNOSIS:  Same  PROCEDURE:  REVERSE SHOULDER ARTHROPLASTY  SURGEON:  Nicholes Stairs, MD  ASSISTANT: April Green, RNFA  ANESTHESIA:   General  ESTIMATED BLOOD LOSS: 100 cc  PREOPERATIVE INDICATIONS:  [name] [name] is a  78 y.o. male with a diagnosis of Right shoulder arthropathy who failed conservative measures and elected for surgical management.    The risks benefits and alternatives were discussed with the patient preoperatively including but not limited to the risks of infection, bleeding, nerve injury, cardiopulmonary complications, the need for revision surgery, dislocation, brachial plexus palsy, incomplete relief of pain, among others, and the patient was willing to proceed.  OPERATIVE IMPLANTS: Biomet size 11 mini humeral stem press-fit standard with a 40 mm reverse shoulder arthroplasty tray with a standard liner and a 36 + 3 mm glenosphere with a mini baseplate with small augment oriented at the 12 o'clock and 4 locking screws and one central nonlocking screw.  OPERATIVE FINDINGS:  Massive retracted tear of the supraspinatus and infraspinatus with high riding humeral head.  The subscapularis was thinned significantly but intact.  The long head of the biceps was absent from the joint.  OPERATIVE PROCEDURE: The patient was brought to the operating room and placed in the supine position. General anesthesia was administered. IV antibiotics were given. A Foley was placed. Time out was performed. The upper extremity was prepped and draped in usual sterile fashion. The patient was in a beachchair position. Deltopectoral approach was carried out. The subscapularis was released off of the bone and tagged with retraction stitch.  I then performed circumferential releases of the humerus, and then dislocated the head, and then reamed with the reamer to the  above named size.  I then applied the jig, and cut the humeral head in 30 of retroversion, and then turned my attention to the glenoid.  Deep retractors were placed, and I resected the labrum, and then placed a guidepin into the center position on the glenoid, with slight inferior inclination. I then reamed over the guidepin, and this created a small metaphyseal cancellus blush inferiorly, removing just the cartilage to the subchondral bone superiorly. Based off of preoperative CT planning software we placed a superior augment with the small option.  We did the step reamer to receive this implant.  The base plate was selected and impacted place, and then I secured it centrally with a nonlocking screw, and I had excellent purchase both inferiorly and superiorly. I placed a short locking screws on anterior and posterior aspects.  I then turned my attention to the glenosphere, and impacted this into place, placing slight inferior offset (set on B).   The glenoid sphere was completely seated, and had engagement of the [facility] taper. I then turned my attention back to the humerus.  I sequentially broached, and then trialed, and was found to restore soft tissue tension, and it had 2 finger tightness. Therefore the above named components were selected. The shoulder felt stable throughout functional motion.  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  I then impacted the real prosthesis into place, as well as the real humeral tray, and reduced the shoulder. The shoulder had excellent motion, and was stable, and I irrigated the wounds copiously.     I then  irrigated the shoulder copiously once more, repaired the deltopectoral interval with Vicryl followed by subcutaneous Vicryl with Steri-Strips and sterile gauze for the skin. The patient was awakened and returned back in stable and satisfactory  condition. There no complications and He tolerated the procedure well.  Prior to closing subcutaneous tissue 1 g of vancomycin powder was placed in the incision.  All counts were correct x2.  There were no noted intraoperative complications.  Patient was extubated and transported to PACU in stable condition.    Disposition:  [name] was transported the PACU in stable condition.  He was placed in a simple sling for the right arm.  He will be in the sling for the first 2 weeks.  He may remove this for ADLs and occupational therapy.  Otherwise he will be admitted for routine postoperative care.  No weightbearing/lifting to the right arm.  Nicholes Stairs

## 2019-01-21 ENCOUNTER — Encounter (HOSPITAL_COMMUNITY): Payer: Self-pay | Admitting: Orthopedic Surgery

## 2019-01-21 MED ORDER — ONDANSETRON 4 MG PO TBDP: 4.0000 mg | ORAL_TABLET | Freq: Three times a day (TID) | ORAL | 0 refills | Status: DC | PRN

## 2019-01-21 MED ORDER — OXYCODONE HCL 5 MG PO TABS: 5.0000 mg | ORAL_TABLET | Freq: Four times a day (QID) | ORAL | 0 refills | Status: DC | PRN

## 2019-01-21 NOTE — Anesthesia Postprocedure Evaluation (Signed)
Anesthesia Post Note  Patient: [name] [name]  Procedure(s) Performed: REVERSE SHOULDER ARTHROPLASTY (Right Shoulder)     Patient location during evaluation: PACU Anesthesia Type: General Level of consciousness: awake and alert Pain management: pain level controlled Vital Signs Assessment: post-procedure vital signs reviewed and stable Respiratory status: spontaneous breathing, nonlabored ventilation, respiratory function stable and patient connected to nasal cannula oxygen Cardiovascular status: blood pressure returned to baseline and stable Postop Assessment: no apparent nausea or vomiting Anesthetic complications: no    Last Vitals:  Vitals:   01/21/19 0722 01/21/19 1202  BP: 116/85 112/64  Pulse: 78 (!) 58  Resp: 16 16  Temp: 36.5 C 36.6 C  SpO2: 94% 100%    Last Pain:  Vitals:   01/21/19 0722  TempSrc: Oral  PainSc:                  Montez Hageman

## 2019-01-21 NOTE — Progress Notes (Addendum)
Occupational Therapy Treatment Patient Details Name: [name] [name] MRN: [id number] DOB: June 01, 1940 Today's Date: 01/21/2019    History of present illness Pt is a 78 y/o male s/p right reverse shoulder arthroplasty. PMJ: AAA, arthritis, chronic low back pain.    OT comments  Returned to see patient per patients request to further educate spouse on sling, exercises and ADL compensatory techniques.  Seated EOB initiated sling education and exercises to spouse, but spouse feeling unwell and becoming unresponsive requiring RN/medical staff attention (see general comments for further information).  Patient voices he will be able to manage with information from prior session and handouts, requesting further education on UE dressing techniques which therapist provided, but therapist is concerned with assist available at home as pt continued to need assistance with sling mgmt, ADLs due to restrictions to R shoulder.  Will continue to follow acutely.    Follow Up Recommendations  Follow surgeon's recommendation for DC plan and follow-up therapies;Supervision - Intermittent    Equipment Recommendations  None recommended by OT    Recommendations for Other Services      Precautions / Restrictions Precautions Precautions: Shoulder Type of Shoulder Precautions: passive protocol: no AROM at shoulder (passive OK 90FF, 30 ER only); AROM elbow, wrist, hand; NWB, sling at all times; pendulums okay  Shoulder Interventions: Shoulder sling/immobilizer;At all times;Off for dressing/bathing/exercises Precaution Booklet Issued: Yes (comment) Precaution Comments: reviewed in depth with patient  Required Braces or Orthoses: Sling Restrictions Weight Bearing Restrictions: Yes RUE Weight Bearing: Non weight bearing       Mobility Bed Mobility Overal bed mobility: Modified Independent Bed Mobility: Supine to Sit;Sit to Supine     Supine to sit: Modified independent (Device/Increase time) Sit to supine:  Modified independent (Device/Increase time)   General bed mobility comments: no assist required, educated on positioning of UE in bed  Transfers                      Balance                                           ADL either performed or assessed with clinical judgement   ADL Overall ADL's : Needs assistance/impaired                 Upper Body Dressing : Minimal assistance;Sitting Upper Body Dressing Details (indicate cue type and reason): reviewed compensatory technique with donning shirt following precautions with patient                  Functional mobility during ADLs: Supervision/safety       Vision       Perception     Praxis      Cognition Arousal/Alertness: Awake/alert Behavior During Therapy: WFL for tasks assessed/performed Overall Cognitive Status: Within Functional Limits for tasks assessed                                          Exercises Exercises: Shoulder;Other exercises Shoulder Exercises Shoulder Flexion: PROM;Right;5 reps;Supine(to 45 degrees only, allowed to 90) Elbow Flexion: PROM;Right;5 reps;Seated Elbow Extension: PROM;Right;5 reps;Seated Wrist Flexion: AROM;Right;5 reps;Supine Wrist Extension: AROM;Right;5 reps;Seated Digit Composite Flexion: AROM;Right;5 reps;Seated Composite Extension: AROM;Right;5 reps;Seated Other Exercises Other Exercises: supination/pronation R UE 5 reps, PROM seated   Shoulder  Instructions Shoulder Instructions Donning/doffing shirt without moving shoulder: Moderate assistance Donning/doffing sling/immobilizer: Maximal assistance Correct positioning of sling/immobilizer: Minimal assistance Sling wearing schedule (on at all times/off for ADL's): Supervision/safety     General Comments Returned to see patient as spouse arrived to provide sling and exercise education to patient/spouse. Initated educated by demonstrating how to doff sling when pt's spouse  reports "not feeling well", removing her sweater.  She voiced, "I am listening, keep going", OT proceeded to educate on exercises to R UE when patient spouse voiced again "I just don't feel well" and went to sit in recliner.  Continued education per spouses request, but noted spouse closing eyes.  Went to check on spouse, unable to arouse with sternal rubs, called code and alerted RN/medical staff for assistance.  With RN, spouse became responsive with deep sternal pressure.  Spouse continuing to report feeling "unwell".  Ended education with patient, patient reports no concern about dc home but therapist voiced concerns with patients need for assistance due to decreased functional use of R UE post surgical.     Pertinent Vitals/ Pain       Pain Assessment: Faces Faces Pain Scale: Hurts a little bit Pain Location: R shoulder Pain Descriptors / Indicators: Discomfort;Numbness;Operative site guarding Pain Intervention(s): Limited activity within patient's tolerance;Monitored during session;Repositioned  Home Living                                          Prior Functioning/Environment              Frequency  Min 2X/week        Progress Toward Goals  OT Goals(current goals can now be found in the care plan section)  Progress towards OT goals: Progressing toward goals  Acute Rehab OT Goals Patient Stated Goal: to feel better OT Goal Formulation: With patient Time For Goal Achievement: 02/04/19 Potential to Achieve Goals: Good  Plan Discharge plan remains appropriate;Frequency remains appropriate    Co-evaluation                 AM-PAC OT "6 Clicks" Daily Activity     Outcome Measure   Help from another person eating meals?: A Little Help from another person taking care of personal grooming?: A Little Help from another person toileting, which includes using toliet, bedpan, or urinal?: A Little Help from another person bathing (including washing,  rinsing, drying)?: A Lot Help from another person to put on and taking off regular upper body clothing?: A Lot Help from another person to put on and taking off regular lower body clothing?: A Little 6 Click Score: 16    End of Session Equipment Utilized During Treatment: Other (comment)(sling)  OT Visit Diagnosis: Muscle weakness (generalized) (M62.81);Pain Pain - Right/Left: Right Pain - part of body: Shoulder   Activity Tolerance Patient tolerated treatment well   Patient Left with call bell/phone within reach;in bed   Nurse Communication Mobility status        Time: [id number] OT Time Calculation (min): 17 min  Charges: OT General Charges $OT Visit: 1 Visit OT Treatments $Therapeutic Exercise: 8-22 mins  Delight Stare, [place] Pager [phone] Office [phone]    Delight Stare 01/21/2019, 4:58 PM

## 2019-01-21 NOTE — Evaluation (Signed)
Occupational Therapy Evaluation Patient Details Name: [name] [name] MRN: [id number] DOB: 17-Mar-1941 Today's Date: 01/21/2019    History of Present Illness Pt is a 78 y/o male s/p right reverse shoulder arthroplasty. PMJ: AAA, arthritis, chronic low back pain.    Clinical Impression   PTA patient independent. Admitted for above and limited by decreased functional use of dominant R UE due to precautions from surgery.  Educated on precautions, sling mgmt and use, exercises, positioning, use of ice and elevation for pain/edema mgmt, ADL compensatory techniques, and safety.  Pt will have support of spouse to assist 24/7.  Currently requires min-mod assist for UB ADLs, min assist for LB ADLs, supervision for basic transfers, total assist for sling mgmt.  Pt requires assist for elbow and forearm exercises with PROM due to nerve block still intact.  He will benefit from further OT services while admitted to maximize independence with ADLs, provide further education to spouse (if needed).  Will follow acutely, but anticipate no further needs after dc home.     Follow Up Recommendations  Follow surgeon's recommendation for DC plan and follow-up therapies;Supervision - Intermittent    Equipment Recommendations  None recommended by OT    Recommendations for Other Services       Precautions / Restrictions Precautions Precautions: Shoulder Type of Shoulder Precautions: passive protocol: no AROM at shoulder (passive OK 90FF, 30 ER only); AROM elbow, wrist, hand; NWB, sling at all times; pendulums okay  Shoulder Interventions: Shoulder sling/immobilizer;At all times;Off for dressing/bathing/exercises Precaution Booklet Issued: Yes (comment) Precaution Comments: reviewed in depth with patient  Required Braces or Orthoses: Sling Restrictions Weight Bearing Restrictions: Yes RUE Weight Bearing: Non weight bearing      Mobility Bed Mobility Overal bed mobility: Needs Assistance Bed Mobility:  Supine to Sit;Sit to Supine     Supine to sit: Modified independent (Device/Increase time) Sit to supine: Modified independent (Device/Increase time)   General bed mobility comments: no assist required, educated on positioning of UE in bed  Transfers Overall transfer level: Needs assistance Equipment used: None Transfers: Sit to/from Stand Sit to Stand: Supervision         General transfer comment: for safety    Balance Overall balance assessment: Mild deficits observed, not formally tested                                         ADL either performed or assessed with clinical judgement   ADL Overall ADL's : Needs assistance/impaired     Grooming: Minimal assistance;Sitting   Upper Body Bathing: Minimal assistance;Sitting   Lower Body Bathing: Minimal assistance;Sit to/from stand   Upper Body Dressing : Moderate assistance;Sitting   Lower Body Dressing: Sit to/from stand;Moderate assistance   Toilet Transfer: Supervision/safety;Ambulation   Toileting- Clothing Manipulation and Hygiene: Minimal assistance;Sit to/from stand       Functional mobility during ADLs: Supervision/safety General ADL Comments: pt limited by decreased functional use of dominant R UE after surgery, patient educated on precautions, ADL compensatory techniques, and sling mgmt/wear schedule      Vision   Vision Assessment?: No apparent visual deficits     Perception     Praxis      Pertinent Vitals/Pain Pain Assessment: Faces Faces Pain Scale: Hurts a little bit Pain Location: R shoulder Pain Descriptors / Indicators: Discomfort;Numbness;Operative site guarding Pain Intervention(s): Limited activity within patient's tolerance;Repositioned;Monitored during session;Ice applied  Hand Dominance Right   Extremity/Trunk Assessment Upper Extremity Assessment Upper Extremity Assessment: RUE deficits/detail RUE Deficits / Details: post surgical shoulder with nerve  block intact; AROM WFL hand/wrist, PROM WFL elbow/forearm  RUE: Unable to fully assess due to immobilization RUE Sensation: (nerve block intact) RUE Coordination: decreased fine motor;decreased gross motor   Lower Extremity Assessment Lower Extremity Assessment: Overall WFL for tasks assessed   Cervical / Trunk Assessment Cervical / Trunk Assessment: Normal   Communication Communication Communication: No difficulties   Cognition Arousal/Alertness: Awake/alert Behavior During Therapy: WFL for tasks assessed/performed Overall Cognitive Status: Within Functional Limits for tasks assessed                                     General Comments       Exercises Exercises: Shoulder;Other exercises Shoulder Exercises Shoulder Flexion: PROM;Right;5 reps;Supine(to 45 degrees only, allowed to 90) Shoulder External Rotation: PROM;Right;5 reps;Supine(to 30 degrees) Elbow Flexion: PROM;Right;5 reps;Supine Elbow Extension: PROM;Right;5 reps;Supine Wrist Flexion: AROM;Right;5 reps;Supine Wrist Extension: AROM;Right;5 reps;Supine Digit Composite Flexion: AROM;Right;5 reps;Supine Composite Extension: AROM;Right;5 reps;Supine Other Exercises Other Exercises: supination/pronation R UE 5 reps, PROM supine   Shoulder Instructions Shoulder Instructions Donning/doffing shirt without moving shoulder: Moderate assistance Method for sponge bathing under operated UE: Moderate assistance Donning/doffing sling/immobilizer: Maximal assistance Correct positioning of sling/immobilizer: Maximal assistance Pendulum exercises (written home exercise program): (verbally reviewed but pt did not complete today) ROM for elbow, wrist and digits of operated UE: Moderate assistance(PROM required for elbow/forearm as nerve block intact) Sling wearing schedule (on at all times/off for ADL's): Supervision/safety Proper positioning of operated UE when showering: Minimal assistance Positioning of UE while  sleeping: Moderate assistance    Home Living Family/patient expects to be discharged to:: Private residence Living Arrangements: Spouse/significant other Available Help at Discharge: Family;Available 24 hours/day Type of Home: House       Home Layout: One level     Bathroom Shower/Tub: Occupational psychologist: Standard     Home Equipment: Shower seat          Prior Functioning/Environment Level of Independence: Independent        Comments: noted limited functional use of R shoulder PTA         OT Problem List: Decreased range of motion;Decreased activity tolerance;Decreased coordination;Decreased safety awareness;Decreased knowledge of use of DME or AE;Decreased knowledge of precautions;Impaired UE functional use;Pain      OT Treatment/Interventions: Self-care/ADL training;DME and/or AE instruction;Therapeutic exercise;Therapeutic activities;Patient/family education;Balance training    OT Goals(Current goals can be found in the care plan section) Acute Rehab OT Goals Patient Stated Goal: to feel better OT Goal Formulation: With patient Time For Goal Achievement: 02/04/19 Potential to Achieve Goals: Good  OT Frequency: Min 2X/week   Barriers to D/C:            Co-evaluation              AM-PAC OT "6 Clicks" Daily Activity     Outcome Measure Help from another person eating meals?: A Little Help from another person taking care of personal grooming?: A Little Help from another person toileting, which includes using toliet, bedpan, or urinal?: A Little Help from another person bathing (including washing, rinsing, drying)?: A Lot Help from another person to put on and taking off regular upper body clothing?: A Lot Help from another person to put on and taking off regular lower body clothing?: A  Little 6 Click Score: 16   End of Session Equipment Utilized During Treatment: Other (comment)(sling) Nurse Communication: Mobility status  Activity  Tolerance: Patient tolerated treatment well Patient left: in bed;with call bell/phone within reach  OT Visit Diagnosis: Muscle weakness (generalized) (M62.81);Pain Pain - Right/Left: Right Pain - part of body: Shoulder                Time: [id number] OT Time Calculation (min): 23 min Charges:  OT General Charges $OT Visit: 1 Visit OT Evaluation $OT Eval Low Complexity: 1 Low OT Treatments $Self Care/Home Management : 8-22 mins  Delight Stare, OT Acute Rehabilitation Services Pager [phone] Office [phone]    Delight Stare 01/21/2019, 9:19 AM

## 2019-01-21 NOTE — Progress Notes (Signed)
   Subjective:  Patient reports pain as mild.  Block has resolved.  Nausea this am.  Denies SOB/CP.  Objective:   VITALS:   Vitals:   01/21/19 0427 01/21/19 0722 01/21/19 1202 01/21/19 1519  BP: 106/68 116/85 112/64 108/77  Pulse: 67 78 (!) 58 (!) 58  Resp: 18 16 16 16   Temp: 98.1 F (36.7 C) 97.7 F (36.5 C) 97.8 F (36.6 C) 98.3 F (36.8 C)  TempSrc: Oral Oral  Oral  SpO2: 95% 94% 100% 99%  Weight:      Height:        Neurologically intact Neurovascular intact Sensation intact distally Intact pulses distally Incision: dressing C/D/I Compartment soft   Lab Results  Component Value Date   WBC 8.1 01/20/2019   HGB 15.2 01/20/2019   HCT 45.5 01/20/2019   MCV 94.8 01/20/2019   PLT 240 01/20/2019   BMET    Component Value Date/Time   NA 137 01/20/2019 1038   K 3.7 01/20/2019 1038   CL 104 01/20/2019 1038   CO2 21 (L) 01/20/2019 1038   GLUCOSE 249 (H) 01/20/2019 1038   BUN 14 01/20/2019 1038   CREATININE 1.35 (H) 01/20/2019 1038   CREATININE 0.93 11/22/2014 0736   CALCIUM 9.3 01/20/2019 1038   GFRNONAA 50 (L) 01/20/2019 1038   GFRAA 58 (L) 01/20/2019 1038     Assessment/Plan: 1 Day Post-Op   Active Problems:   Rotator cuff arthropathy of right shoulder   S/P reverse total shoulder arthroplasty, right   Up with therapy - NWB to RUE, sling unless ADLS or exercise - dc home today - follow up 2 weeks  - maintain post op dressing until follow up.   Nicholes Stairs 01/21/2019, 4:25 PM   Geralynn Rile, MD [phone]

## 2019-01-22 NOTE — Discharge Summary (Signed)
Patient ID: [name] [name] MRN: [id number] DOB/AGE: 1941-03-18 78 y.o.  Admit date: 01/20/2019 Discharge date: 01/21/2019  Primary Diagnosis: Right shoulder rotator cuff arthropathy  Admission Diagnoses:  Past Medical History:  Diagnosis Date  . AAA (abdominal aortic aneurysm) ([place])   . Arthritis    "fingers"  (02/09/2016)  . Basal cell carcinoma of cheek 2012  . Chronic low back pain    pt. denies  . DDD (degenerative disc disease), lumbar   . Headache   . History of kidney stones   . Hyperlipidemia    Discharge Diagnoses:   Active Problems:   Rotator cuff arthropathy of right shoulder   S/P reverse total shoulder arthroplasty, right  Estimated body mass index is 24.96 kg/m as calculated from the following:   Height as of this encounter: 5\' 5"  (1.651 m).   Weight as of this encounter: 68 kg.  Procedure:  Procedure(s) (LRB): REVERSE SHOULDER ARTHROPLASTY (Right)   Consults: None  HPI: Admitted for operative treatment of right shoulder with routine postoperative admission and perioperative care. Laboratory Data: Admission on 01/20/2019, Discharged on 01/21/2019  Component Date Value Ref Range Status  . WBC 01/20/2019 8.1  4.0 - 10.5 K/uL Final  . RBC 01/20/2019 4.80  4.22 - 5.81 MIL/uL Final  . Hemoglobin 01/20/2019 15.2  13.0 - 17.0 g/dL Final  . HCT 01/20/2019 45.5  39.0 - 52.0 % Final  . MCV 01/20/2019 94.8  80.0 - 100.0 fL Final  . MCH 01/20/2019 31.7  26.0 - 34.0 pg Final  . MCHC 01/20/2019 33.4  30.0 - 36.0 g/dL Final  . RDW 01/20/2019 13.1  11.5 - 15.5 % Final  . Platelets 01/20/2019 240  150 - 400 K/uL Final  . nRBC 01/20/2019 0.0  0.0 - 0.2 % Final   Performed at [facility], [place] [address], [place], [place] [address]  . Sodium 01/20/2019 137  135 - 145 mmol/L Final  . Potassium 01/20/2019 3.7  3.5 - 5.1 mmol/L Final  . Chloride 01/20/2019 104  98 - 111 mmol/L Final  . CO2 01/20/2019 21* 22 - 32 mmol/L Final  . Glucose, Bld 01/20/2019 249* 70 -  99 mg/dL Final  . BUN 01/20/2019 14  8 - 23 mg/dL Final  . Creatinine, Ser 01/20/2019 1.35* 0.61 - 1.24 mg/dL Final  . Calcium 01/20/2019 9.3  8.9 - 10.3 mg/dL Final  . GFR calc non Af Amer 01/20/2019 50* >60 mL/min Final  . GFR calc Af Amer 01/20/2019 58* >60 mL/min Final  . Anion gap 01/20/2019 12  5 - 15 Final   Performed at [facility], [place] [address] [place], [place] [address]  Hospital Outpatient Visit on 01/16/2019  Component Date Value Ref Range Status  . SARS-CoV-2, NAA 01/16/2019 NOT DETECTED  NOT DETECTED Final   Comment: (NOTE) This nucleic acid amplification test was developed and its performance characteristics determined by [employer]. Nucleic acid amplification tests include PCR and TMA. This test has not been FDA cleared or approved. This test has been authorized by FDA under an Emergency Use Authorization (EUA). This test is only authorized for the duration of time the declaration that circumstances exist justifying the authorization of the emergency use of in vitro diagnostic tests for detection of SARS-CoV-2 virus and/or diagnosis of COVID-19 infection under section 564(b)(1) of the Act, 21 U.S.C. [id number]) (1), unless the authorization is terminated or revoked sooner. When diagnostic testing is negative, the possibility of a false negative result should be  considered in the context of a patient's recent exposures and the presence of clinical signs and symptoms consistent with COVID-19. An individual without symptoms of COVID- 19 and who is not shedding SARS-CoV-2 vi                          rus would expect to have a negative (not detected) result in this assay. Performed At: [facility] [place], [place] [id number] Rush Farmer MD [id number]   . Coronavirus Source 01/16/2019 NASOPHARYNGEAL   Final   Performed at [facility], [place] [address], [place], [place] [address]  Hospital Outpatient Visit on  12/30/2018  Component Date Value Ref Range Status  . SARS-CoV-2, NAA 12/30/2018 NOT DETECTED  NOT DETECTED Final   Comment: (NOTE) This nucleic acid amplification test was developed and its performance characteristics determined by [employer]. Nucleic acid amplification tests include PCR and TMA. This test has not been FDA cleared or approved. This test has been authorized by FDA under an Emergency Use Authorization (EUA). This test is only authorized for the duration of time the declaration that circumstances exist justifying the authorization of the emergency use of in vitro diagnostic tests for detection of SARS-CoV-2 virus and/or diagnosis of COVID-19 infection under section 564(b)(1) of the Act, 21 U.S.C. [id number]) (1), unless the authorization is terminated or revoked sooner. When diagnostic testing is negative, the possibility of a false negative result should be considered in the context of a patient's recent exposures and the presence of clinical signs and symptoms consistent with COVID-19. An individual without symptoms of COVID- 19 and who is not shedding SARS-CoV-2 vi                          rus would expect to have a negative (not detected) result in this assay. Performed At: [facility] [place], [place] [id number] Rush Farmer MD [id number]   . Coronavirus Source 12/30/2018 NASOPHARYNGEAL   Final   Performed at [facility], [place] [address], [place], [place] [address]  Hospital Outpatient Visit on 12/30/2018  Component Date Value Ref Range Status  . MRSA, PCR 12/30/2018 NEGATIVE  NEGATIVE Final  . Staphylococcus aureus 12/30/2018 NEGATIVE  NEGATIVE Final   Comment: (NOTE) The Xpert SA Assay (FDA approved for NASAL specimens in patients 59 years of age and older), is one component of a comprehensive surveillance program. It is not intended to diagnose infection nor to guide or monitor treatment. Performed at [facility], [place] [address], [place], [place] [address]   . Sodium 12/30/2018 139  135 - 145 mmol/L Final  . Potassium 12/30/2018 4.2  3.5 - 5.1 mmol/L Final  . Chloride 12/30/2018 104  98 - 111 mmol/L Final  . CO2 12/30/2018 24  22 - 32 mmol/L Final  . Glucose, Bld 12/30/2018 122* 70 - 99 mg/dL Final  . BUN 12/30/2018 19  8 - 23 mg/dL Final  . Creatinine, Ser 12/30/2018 1.09  0.61 - 1.24 mg/dL Final  . Calcium 12/30/2018 9.1  8.9 - 10.3 mg/dL Final  . GFR calc non Af Amer 12/30/2018 >60  >60 mL/min Final  . GFR calc Af Amer 12/30/2018 >60  >60 mL/min Final  . Anion gap 12/30/2018 11  5 - 15 Final   Performed at [facility], [place] [address] [place], [place] [address]  . WBC 12/30/2018 8.5  4.0 - 10.5 K/uL Final  . RBC 12/30/2018 4.89  4.22 - 5.81 MIL/uL Final  . Hemoglobin 12/30/2018 15.8  13.0 - 17.0 g/dL Final  . HCT 12/30/2018 47.1  39.0 - 52.0 % Final  . MCV 12/30/2018 96.3  80.0 - 100.0 fL Final  . MCH 12/30/2018 32.3  26.0 - 34.0 pg Final  . MCHC 12/30/2018 33.5  30.0 - 36.0 g/dL Final  . RDW 12/30/2018 13.4  11.5 - 15.5 % Final  . Platelets 12/30/2018 261  150 - 400 K/uL Final  . nRBC 12/30/2018 0.0  0.0 - 0.2 % Final   Performed at [facility], [place] [address] [place], [place] [address]     X-Rays:Dg Shoulder Right Port  Result Date: 01/20/2019 CLINICAL DATA:  Post right shoulder arthroplasty EXAM: PORTABLE RIGHT SHOULDER COMPARISON:  Right shoulder CT 10/14/2018 FINDINGS: Postsurgical changes from reverse right shoulder arthroplasty. Hardware is in expected alignment. No acute complication or hardware failure. Expected postsurgical changes of the soft tissues including intra-articular gas. Mild acromioclavicular arthrosis. Included portions of the right chest wall and lung are unremarkable. IMPRESSION: Postsurgical changes from reverse right shoulder arthroplasty without evidence of acute hardware complication. Electronically Signed   By: [name] Le M.D.   On:  01/20/2019 17:12    EKG: Orders placed or performed in visit on 01/02/19  . EKG 12-Lead     Hospital Course: KEIMON ZEPPIERI is a 78 y.o. who was admitted to Hospital. They were brought to the operating room on 01/20/2019 and underwent Procedure(s): [place].  Patient tolerated the procedure well and was later transferred to the recovery room and then to the orthopaedic floor for postoperative care.  They were given PO and IV analgesics for pain control following their surgery.  They were given 24 hours of postoperative antibiotics of  Anti-infectives (From admission, onward)   Start     Dose/Rate Route Frequency Ordered Stop   01/20/19 1830  ceFAZolin (ANCEF) IVPB 1 g/50 mL premix     1 g 100 mL/hr over 30 Minutes Intravenous Every 6 hours 01/20/19 1534 01/21/19 0559   01/20/19 1359  vancomycin (VANCOCIN) powder  Status:  Discontinued       As needed 01/20/19 1400 01/20/19 1426   01/20/19 1040  ceFAZolin (ANCEF) 2-4 GM/100ML-% IVPB    Note to Pharmacy: Block, Sarah   : cabinet override      01/20/19 1040 01/20/19 1232   01/20/19 1030  ceFAZolin (ANCEF) IVPB 2g/100 mL premix     2 g 200 mL/hr over 30 Minutes Intravenous On call to O.R. 01/20/19 1017 01/20/19 1232     and started on DVT prophylaxis in the form of Aspirin.  OT was ordered for total joint protocol.  Discharge planning consulted to help with postop disposition and equipment needs.  Patient had a good night on the evening of surgery.   By day one, the patient had progressed with therapy and meeting their goals.  Incision was healing well.  Patient was seen in rounds and was ready to go home.   Diet: Regular diet Activity:NWB Follow-up:in 2 weeks Disposition - Home Discharged Condition: good   Discharge Instructions    Call MD / Call 911   Complete by: As directed    If you experience chest pain or shortness of breath, CALL 911 and be transported to the hospital emergency room.  If you develope a  fever above 101 F, pus (white drainage) or increased drainage or  redness at the wound, or calf pain, call your surgeon's office.   Constipation Prevention   Complete by: As directed    Drink plenty of fluids.  Prune juice may be helpful.  You may use a stool softener, such as Colace (over the counter) 100 mg twice a day.  Use MiraLax (over the counter) for constipation as needed.   Diet - low sodium heart healthy   Complete by: As directed    Increase activity slowly as tolerated   Complete by: As directed      Allergies as of 01/21/2019      Reactions   Bee Venom Hives, Swelling, Rash   Yellow jackets      Medication List    TAKE these medications   acetaminophen 500 MG tablet Commonly known as: TYLENOL Take 1,000 mg by mouth every 6 (six) hours as needed for moderate pain.   aspirin EC 81 MG tablet Take 1 tablet (81 mg total) by mouth daily.   cholecalciferol 1000 units tablet Commonly known as: VITAMIN D Take 1,000 Units by mouth daily.   EpiPen 2-Pak 0.3 mg/0.3 mL Soaj injection Generic drug: EPINEPHrine Inject 0.3 mg into the muscle as needed for anaphylaxis.   Garlic [id number] MG Caps Take 500 mg by mouth daily.   GLUCOSAMINE-CHONDROITIN PO Take 1,200 mg by mouth daily.   hydroxypropyl methylcellulose / hypromellose 2.5 % ophthalmic solution Commonly known as: ISOPTO TEARS / GONIOVISC Place 1 drop into both eyes 3 (three) times daily as needed for dry eyes.   ibuprofen 200 MG tablet Commonly known as: ADVIL Take 400 mg by mouth daily as needed for headache or moderate pain.   loratadine 10 MG tablet Commonly known as: CLARITIN Take 10 mg by mouth daily.   Ocuvite Adult 50+ Caps Take 1 capsule by mouth daily.   CENTRUM SILVER PO Take 1 tablet by mouth daily.   ondansetron 4 MG disintegrating tablet Commonly known as: Zofran ODT Take 1 tablet (4 mg total) by mouth every 8 (eight) hours as needed.   oxyCODONE 5 MG immediate release tablet Commonly known as:  Roxicodone Take 1-2 tablets (5-10 mg total) by mouth every 6 (six) hours as needed for severe pain or breakthrough pain.   oxyCODONE-acetaminophen 5-325 MG tablet Commonly known as: PERCOCET/ROXICET Take 1 tablet by mouth every 6 (six) hours as needed for moderate pain.   simvastatin 40 MG tablet Commonly known as: ZOCOR Take 40 mg by mouth every evening.      Follow-up Information    Nicholes Stairs, MD. Call in 2 weeks.   Specialty: Orthopedic Surgery Why: For suture removal Contact information: [address] [place] [address] [phone]           Signed: Geralynn Rile, MD Orthopaedic Surgery 01/22/2019, 10:58 AM

## 2019-02-03 DIAGNOSIS — M25511 Pain in right shoulder: Secondary | ICD-10-CM | POA: Diagnosis not present

## 2019-02-05 DIAGNOSIS — M25511 Pain in right shoulder: Secondary | ICD-10-CM | POA: Diagnosis not present

## 2019-02-09 DIAGNOSIS — M25511 Pain in right shoulder: Secondary | ICD-10-CM | POA: Diagnosis not present

## 2019-02-11 DIAGNOSIS — M25511 Pain in right shoulder: Secondary | ICD-10-CM | POA: Diagnosis not present

## 2019-02-16 DIAGNOSIS — M25511 Pain in right shoulder: Secondary | ICD-10-CM | POA: Diagnosis not present

## 2019-02-23 DIAGNOSIS — M25511 Pain in right shoulder: Secondary | ICD-10-CM | POA: Diagnosis not present

## 2019-02-24 DIAGNOSIS — H52203 Unspecified astigmatism, bilateral: Secondary | ICD-10-CM | POA: Diagnosis not present

## 2019-02-24 DIAGNOSIS — H2513 Age-related nuclear cataract, bilateral: Secondary | ICD-10-CM | POA: Diagnosis not present

## 2019-02-24 DIAGNOSIS — H524 Presbyopia: Secondary | ICD-10-CM | POA: Diagnosis not present

## 2019-02-25 DIAGNOSIS — M25511 Pain in right shoulder: Secondary | ICD-10-CM | POA: Diagnosis not present

## 2019-03-03 DIAGNOSIS — M25511 Pain in right shoulder: Secondary | ICD-10-CM | POA: Diagnosis not present

## 2019-03-04 DIAGNOSIS — Z4789 Encounter for other orthopedic aftercare: Secondary | ICD-10-CM | POA: Diagnosis not present

## 2019-03-09 DIAGNOSIS — M25511 Pain in right shoulder: Secondary | ICD-10-CM | POA: Diagnosis not present

## 2019-03-17 DIAGNOSIS — M25511 Pain in right shoulder: Secondary | ICD-10-CM | POA: Diagnosis not present

## 2019-03-26 DIAGNOSIS — M25511 Pain in right shoulder: Secondary | ICD-10-CM | POA: Diagnosis not present

## 2019-03-30 DIAGNOSIS — M25511 Pain in right shoulder: Secondary | ICD-10-CM | POA: Diagnosis not present

## 2019-03-31 DIAGNOSIS — Z4789 Encounter for other orthopedic aftercare: Secondary | ICD-10-CM | POA: Diagnosis not present

## 2019-04-02 DIAGNOSIS — M25511 Pain in right shoulder: Secondary | ICD-10-CM | POA: Diagnosis not present

## 2019-04-06 ENCOUNTER — Ambulatory Visit: Payer: Medicare Other | Attending: Internal Medicine

## 2019-04-06 DIAGNOSIS — Z23 Encounter for immunization: Secondary | ICD-10-CM | POA: Insufficient documentation

## 2019-04-06 DIAGNOSIS — M25511 Pain in right shoulder: Secondary | ICD-10-CM | POA: Diagnosis not present

## 2019-04-06 NOTE — Progress Notes (Signed)
   Covid-19 Vaccination Clinic  Name:  GERREN COGLIANO    MRN: [id number] DOB: 06-17-1940  04/06/2019  Mr. Schettino was observed post Covid-19 immunization for 15 minutes without incidence. He was provided with Vaccine Information Sheet and instruction to access the V-Safe system.   Mr. Andreola was instructed to call 911 with any severe reactions post vaccine: Marland Kitchen Difficulty breathing  . Swelling of your face and throat  . A fast heartbeat  . A bad rash all over your body  . Dizziness and weakness

## 2019-04-09 DIAGNOSIS — M25511 Pain in right shoulder: Secondary | ICD-10-CM | POA: Diagnosis not present

## 2019-04-09 DIAGNOSIS — L821 Other seborrheic keratosis: Secondary | ICD-10-CM | POA: Diagnosis not present

## 2019-04-09 DIAGNOSIS — Z85828 Personal history of other malignant neoplasm of skin: Secondary | ICD-10-CM | POA: Diagnosis not present

## 2019-04-09 DIAGNOSIS — L309 Dermatitis, unspecified: Secondary | ICD-10-CM | POA: Diagnosis not present

## 2019-04-09 DIAGNOSIS — L720 Epidermal cyst: Secondary | ICD-10-CM | POA: Diagnosis not present

## 2019-04-09 DIAGNOSIS — D1801 Hemangioma of skin and subcutaneous tissue: Secondary | ICD-10-CM | POA: Diagnosis not present

## 2019-04-09 DIAGNOSIS — L570 Actinic keratosis: Secondary | ICD-10-CM | POA: Diagnosis not present

## 2019-04-13 DIAGNOSIS — M25511 Pain in right shoulder: Secondary | ICD-10-CM | POA: Diagnosis not present

## 2019-04-16 DIAGNOSIS — M25511 Pain in right shoulder: Secondary | ICD-10-CM | POA: Diagnosis not present

## 2019-04-23 DIAGNOSIS — M25511 Pain in right shoulder: Secondary | ICD-10-CM | POA: Diagnosis not present

## 2019-04-24 ENCOUNTER — Ambulatory Visit: Payer: PPO

## 2019-04-25 ENCOUNTER — Ambulatory Visit: Payer: PPO | Attending: Internal Medicine

## 2019-04-25 DIAGNOSIS — Z23 Encounter for immunization: Secondary | ICD-10-CM | POA: Insufficient documentation

## 2019-04-25 NOTE — Progress Notes (Signed)
   Covid-19 Vaccination Clinic  Name:  [name] [name]    MRN: [id number] DOB: Dec 29, 1940  04/25/2019  [name] [name] was observed post Covid-19 immunization for 15 minutes without incidence. He was provided with Vaccine Information Sheet and instruction to access the V-Safe system.   [name] [name] was instructed to call 911 with any severe reactions post vaccine: Marland Kitchen Difficulty breathing  . Swelling of your face and throat  . A fast heartbeat  . A bad rash all over your body  . Dizziness and weakness    Immunizations Administered    Name Date Dose VIS Date Route   Pfizer COVID-19 Vaccine 04/25/2019  8:25 AM 0.3 mL 03/06/2019 Intramuscular   Manufacturer: [place]   Lot: [id number]   [place]: [id number]

## 2019-04-28 DIAGNOSIS — M25511 Pain in right shoulder: Secondary | ICD-10-CM | POA: Diagnosis not present

## 2019-04-30 DIAGNOSIS — M25511 Pain in right shoulder: Secondary | ICD-10-CM | POA: Diagnosis not present

## 2019-05-04 DIAGNOSIS — M25511 Pain in right shoulder: Secondary | ICD-10-CM | POA: Diagnosis not present

## 2019-05-05 ENCOUNTER — Other Ambulatory Visit: Payer: Self-pay

## 2019-05-05 DIAGNOSIS — I714 Abdominal aortic aneurysm, without rupture, unspecified: Secondary | ICD-10-CM

## 2019-05-06 ENCOUNTER — Encounter: Payer: Self-pay | Admitting: Vascular Surgery

## 2019-05-06 ENCOUNTER — Ambulatory Visit (INDEPENDENT_AMBULATORY_CARE_PROVIDER_SITE_OTHER): Payer: PPO | Admitting: Vascular Surgery

## 2019-05-06 ENCOUNTER — Ambulatory Visit (HOSPITAL_COMMUNITY)
Admission: RE | Admit: 2019-05-06 | Discharge: 2019-05-06 | Disposition: A | Discharge status: Home / Self Care | Payer: PPO | Source: Ambulatory Visit | Attending: Vascular Surgery | Admitting: Vascular Surgery

## 2019-05-06 ENCOUNTER — Other Ambulatory Visit: Payer: Self-pay

## 2019-05-06 VITALS — BP 106/58 | HR 57 | Temp 97.6°F | Resp 20 | Ht 65.0 in | Wt 152.0 lb

## 2019-05-06 DIAGNOSIS — I714 Abdominal aortic aneurysm, without rupture, unspecified: Secondary | ICD-10-CM

## 2019-05-06 NOTE — Progress Notes (Signed)
Patient name: [name] [name] MRN: [id number] DOB: 25-Feb-1941 Sex: male  REASON FOR VISIT:   Follow-up of abdominal aortic aneurysm.  HPI:   [name] [name] is a pleasant 79 y.o. male who underwent repair of a 5.8 cm infrarenal abdominal aortic aneurysm in November 2017.  When I saw him last on 04/16/2018 the aneurysm was 5.6 cm in maximum diameter.  I recommended a follow-up duplex scan in 1 year and he comes in today for that study.  Since I saw him last, he denies any abdominal pain or back pain.  He has had surgery on his right shoulder and is undergoing therapy for this.  He denies any history of claudication, rest pain, or nonhealing ulcers.  He is on aspirin and is on a statin.  He is not a smoker.  Past Medical History:  Diagnosis Date  . AAA (abdominal aortic aneurysm) ([place])   . Arthritis    "fingers"  (02/09/2016)  . Basal cell carcinoma of cheek 2012  . Chronic low back pain    pt. denies  . DDD (degenerative disc disease), lumbar   . Headache   . History of kidney stones   . Hyperlipidemia     Family History  Problem Relation Age of Onset  . Cancer Mother 47       Ovarian  . Heart disease Father 90       Endocarditis  . Other Brother        plane crash    SOCIAL HISTORY: Social History   Tobacco Use  . Smoking status: Former Smoker    Packs/day: 2.50    Years: 33.00    Pack years: 82.50    Types: Cigarettes    Quit date: 03/27/1999    Years since quitting: 20.1  . Smokeless tobacco: Never Used  Substance Use Topics  . Alcohol use: Yes    Alcohol/week: 4.0 standard drinks    Types: 2 Glasses of wine, 2 Shots of liquor per week    Allergies  Allergen Reactions  . Bee Venom Hives, Swelling and Rash    Yellow jackets     Current Outpatient Medications  Medication Sig Dispense Refill  . acetaminophen (TYLENOL) 500 MG tablet Take 1,000 mg by mouth every 6 (six) hours as needed for moderate pain.     Marland Kitchen aspirin EC 81 MG tablet Take 1 tablet  (81 mg total) by mouth daily.    . cholecalciferol (VITAMIN D) 1000 UNITS tablet Take 1,000 Units by mouth daily.    Marland Kitchen EPINEPHrine (EPIPEN 2-PAK) 0.3 mg/0.3 mL IJ SOAJ injection Inject 0.3 mg into the muscle as needed for anaphylaxis.    . Garlic [id number] MG CAPS Take 500 mg by mouth daily.    Marland Kitchen GLUCOSAMINE-CHONDROITIN PO Take 1,200 mg by mouth daily.    . hydroxypropyl methylcellulose / hypromellose (ISOPTO TEARS / GONIOVISC) 2.5 % ophthalmic solution Place 1 drop into both eyes 3 (three) times daily as needed for dry eyes.     Marland Kitchen ibuprofen (ADVIL,MOTRIN) 200 MG tablet Take 400 mg by mouth daily as needed for headache or moderate pain.     Marland Kitchen loratadine (CLARITIN) 10 MG tablet Take 10 mg by mouth daily.    . Multiple Vitamins-Minerals (CENTRUM SILVER PO) Take 1 tablet by mouth daily.    . Multiple Vitamins-Minerals (OCUVITE ADULT 50+) CAPS Take 1 capsule by mouth daily.    . simvastatin (ZOCOR) 40 MG tablet Take 40 mg by mouth every evening.  No current facility-administered medications for this visit.    REVIEW OF SYSTEMS:  [X]  denotes positive finding, [ ]  denotes negative finding Cardiac  Comments:  Chest pain or chest pressure:    Shortness of breath upon exertion:    Short of breath when lying flat:    Irregular heart rhythm:        Vascular    Pain in calf, thigh, or hip brought on by ambulation:    Pain in feet at night that wakes you up from your sleep:     Blood clot in your veins:    Leg swelling:         Pulmonary    Oxygen at home:    Productive cough:     Wheezing:         Neurologic    Sudden weakness in arms or legs:     Sudden numbness in arms or legs:     Sudden onset of difficulty speaking or slurred speech:    Temporary loss of vision in one eye:     Problems with dizziness:         Gastrointestinal    Blood in stool:     Vomited blood:         Genitourinary    Burning when urinating:     Blood in urine:        Psychiatric    Major depression:          Hematologic    Bleeding problems:    Problems with blood clotting too easily:        Skin    Rashes or ulcers:        Constitutional    Fever or chills:     PHYSICAL EXAM:   Vitals:   05/06/19 0848  BP: (!) 106/58  Pulse: (!) 57  Resp: 20  Temp: 97.6 F (36.4 C)  SpO2: 98%  Weight: 152 lb (68.9 kg)  Height: 5\' 5"  (1.651 m)    GENERAL: The patient is a well-nourished male, in no acute distress. The vital signs are documented above. CARDIAC: There is a regular rate and rhythm.  VASCULAR: I do not detect carotid bruits. He has palpable femoral and pedal pulses. PULMONARY: There is good air exchange bilaterally without wheezing or rales. ABDOMEN: Soft and non-tender with normal pitched bowel sounds.  MUSCULOSKELETAL: There are no major deformities or cyanosis. NEUROLOGIC: No focal weakness or paresthesias are detected. SKIN: There are no ulcers or rashes noted. PSYCHIATRIC: The patient has a normal affect.  DATA:    DUPLEX ABDOMINAL AORTA: I have independently interpreted his duplex of the abdominal aorta.  The maximum diameter of his aneurysm is 5.5 cm which is not changed since his study in January 2020 when it was 5.6 cm.  There is no evidence of endoleak.  MEDICAL ISSUES:   ABDOMINAL AORTIC ANEURYSM: This patient is undergone endovascular repair of his 5.8 cm infrarenal abdominal aortic aneurysm in November 2017.  His follow-up duplex scan today shows that the aneurysm is stable at 5.5 cm with no evidence of endoleak.  His blood pressure is under good control.  Is not a smoker.  He is on aspirin and is on a statin.  I have ordered a follow-up duplex scan in 1 year and I will see him back at that time.  He knows to call sooner if he has problems.  Deitra Mayo [facility] [phone]

## 2019-05-07 ENCOUNTER — Other Ambulatory Visit: Payer: Self-pay | Admitting: *Deleted

## 2019-05-07 DIAGNOSIS — I714 Abdominal aortic aneurysm, without rupture, unspecified: Secondary | ICD-10-CM

## 2019-05-07 DIAGNOSIS — M25511 Pain in right shoulder: Secondary | ICD-10-CM | POA: Diagnosis not present

## 2019-05-11 DIAGNOSIS — M25511 Pain in right shoulder: Secondary | ICD-10-CM | POA: Diagnosis not present

## 2019-05-18 DIAGNOSIS — M25511 Pain in right shoulder: Secondary | ICD-10-CM | POA: Diagnosis not present

## 2019-05-21 DIAGNOSIS — M25511 Pain in right shoulder: Secondary | ICD-10-CM | POA: Diagnosis not present

## 2019-05-25 DIAGNOSIS — M25511 Pain in right shoulder: Secondary | ICD-10-CM | POA: Diagnosis not present

## 2019-05-27 DIAGNOSIS — M25511 Pain in right shoulder: Secondary | ICD-10-CM | POA: Diagnosis not present

## 2019-06-01 DIAGNOSIS — M25511 Pain in right shoulder: Secondary | ICD-10-CM | POA: Diagnosis not present

## 2019-06-04 DIAGNOSIS — M25511 Pain in right shoulder: Secondary | ICD-10-CM | POA: Diagnosis not present

## 2019-08-03 DIAGNOSIS — Z0000 Encounter for general adult medical examination without abnormal findings: Secondary | ICD-10-CM | POA: Diagnosis not present

## 2019-08-03 DIAGNOSIS — Z9889 Other specified postprocedural states: Secondary | ICD-10-CM | POA: Diagnosis not present

## 2019-08-03 DIAGNOSIS — J301 Allergic rhinitis due to pollen: Secondary | ICD-10-CM | POA: Diagnosis not present

## 2019-08-03 DIAGNOSIS — G250 Essential tremor: Secondary | ICD-10-CM | POA: Diagnosis not present

## 2019-08-03 DIAGNOSIS — R7301 Impaired fasting glucose: Secondary | ICD-10-CM | POA: Diagnosis not present

## 2019-08-03 DIAGNOSIS — E782 Mixed hyperlipidemia: Secondary | ICD-10-CM | POA: Diagnosis not present

## 2019-08-06 ENCOUNTER — Other Ambulatory Visit: Payer: Self-pay | Admitting: Family Medicine

## 2019-08-06 DIAGNOSIS — G930 Cerebral cysts: Secondary | ICD-10-CM

## 2019-08-21 ENCOUNTER — Other Ambulatory Visit: Payer: Self-pay

## 2019-08-21 ENCOUNTER — Ambulatory Visit
Admission: RE | Admit: 2019-08-21 | Discharge: 2019-08-21 | Disposition: A | Discharge status: Home / Self Care | Payer: PPO | Source: Ambulatory Visit | Attending: Family Medicine | Admitting: Family Medicine

## 2019-08-21 DIAGNOSIS — G930 Cerebral cysts: Secondary | ICD-10-CM

## 2019-08-21 MED ORDER — GADOBENATE DIMEGLUMINE 529 MG/ML IV SOLN
14.0000 mL | Freq: Once | INTRAVENOUS | Status: AC | PRN
  Administered 2019-08-21: 14 mL via INTRAVENOUS

## 2019-08-30 ENCOUNTER — Other Ambulatory Visit: Payer: PPO

## 2019-09-15 ENCOUNTER — Other Ambulatory Visit: Payer: PPO

## 2019-09-23 DIAGNOSIS — R52 Pain, unspecified: Secondary | ICD-10-CM | POA: Diagnosis not present

## 2019-12-08 DIAGNOSIS — Z85828 Personal history of other malignant neoplasm of skin: Secondary | ICD-10-CM | POA: Diagnosis not present

## 2019-12-08 DIAGNOSIS — L309 Dermatitis, unspecified: Secondary | ICD-10-CM | POA: Diagnosis not present

## 2019-12-08 DIAGNOSIS — L570 Actinic keratosis: Secondary | ICD-10-CM | POA: Diagnosis not present

## 2019-12-08 DIAGNOSIS — L821 Other seborrheic keratosis: Secondary | ICD-10-CM | POA: Diagnosis not present

## 2019-12-08 DIAGNOSIS — D692 Other nonthrombocytopenic purpura: Secondary | ICD-10-CM | POA: Diagnosis not present

## 2019-12-08 DIAGNOSIS — D3612 Benign neoplasm of peripheral nerves and autonomic nervous system, upper limb, including shoulder: Secondary | ICD-10-CM | POA: Diagnosis not present

## 2019-12-16 DIAGNOSIS — R251 Tremor, unspecified: Secondary | ICD-10-CM | POA: Diagnosis not present

## 2019-12-16 DIAGNOSIS — G930 Cerebral cysts: Secondary | ICD-10-CM | POA: Diagnosis not present

## 2019-12-16 DIAGNOSIS — R030 Elevated blood-pressure reading, without diagnosis of hypertension: Secondary | ICD-10-CM | POA: Diagnosis not present

## 2020-02-29 DIAGNOSIS — H2513 Age-related nuclear cataract, bilateral: Secondary | ICD-10-CM | POA: Diagnosis not present

## 2020-02-29 DIAGNOSIS — H04123 Dry eye syndrome of bilateral lacrimal glands: Secondary | ICD-10-CM | POA: Diagnosis not present

## 2020-02-29 DIAGNOSIS — H524 Presbyopia: Secondary | ICD-10-CM | POA: Diagnosis not present

## 2020-02-29 DIAGNOSIS — H52203 Unspecified astigmatism, bilateral: Secondary | ICD-10-CM | POA: Diagnosis not present

## 2020-02-29 DIAGNOSIS — H25013 Cortical age-related cataract, bilateral: Secondary | ICD-10-CM | POA: Diagnosis not present

## 2020-03-16 DIAGNOSIS — L570 Actinic keratosis: Secondary | ICD-10-CM | POA: Diagnosis not present

## 2020-03-16 DIAGNOSIS — D485 Neoplasm of uncertain behavior of skin: Secondary | ICD-10-CM | POA: Diagnosis not present

## 2020-03-16 DIAGNOSIS — Z85828 Personal history of other malignant neoplasm of skin: Secondary | ICD-10-CM | POA: Diagnosis not present

## 2020-03-16 DIAGNOSIS — K130 Diseases of lips: Secondary | ICD-10-CM | POA: Diagnosis not present

## 2020-03-21 ENCOUNTER — Encounter: Payer: Self-pay | Admitting: *Deleted

## 2020-03-22 ENCOUNTER — Encounter: Payer: Self-pay | Admitting: Diagnostic Neuroimaging

## 2020-03-22 ENCOUNTER — Ambulatory Visit (INDEPENDENT_AMBULATORY_CARE_PROVIDER_SITE_OTHER): Payer: PPO | Admitting: Diagnostic Neuroimaging

## 2020-03-22 VITALS — BP 169/91 | HR 71 | Ht 65.0 in | Wt 154.0 lb

## 2020-03-22 DIAGNOSIS — G250 Essential tremor: Secondary | ICD-10-CM | POA: Diagnosis not present

## 2020-03-22 MED ORDER — PROPRANOLOL HCL 20 MG PO TABS: 20.0000 mg | ORAL_TABLET | Freq: Two times a day (BID) | ORAL | 6 refills | Status: DC

## 2020-03-22 NOTE — Progress Notes (Signed)
[facility]  PATIENT: [name] [name] DOB: 1940-10-04  REFERRING CLINICIAN: Lisbeth Renshaw, MD HISTORY FROM: patient  REASON FOR VISIT: new consult    HISTORICAL  CHIEF COMPLAINT:  Chief Complaint  Patient presents with  . Tremors    Rm 7 New Pt  "head tremor that I don't feel x 20 years, getting worse"    HISTORY OF PRESENT ILLNESS:   79 year old male here for evaluation of tremor.  Symptoms started more than 20 years ago, gradual onset progressive head tremor.  No specific triggering aggravating factors.  No family history of tremor.  Symptoms noticed by other people and by himself.  He has not noticed any tremor in his arms or voice.  No gait or balance difficulties.  No neck issues.  He has had a left lateral intraventricular cyst that has been monitored over time without any significant change or symptoms.     REVIEW OF SYSTEMS: Full 14 system review of systems performed and negative with exception of: As per HPI.  ALLERGIES: Allergies  Allergen Reactions  . Bee Venom Hives, Swelling and Rash    Yellow jackets     HOME MEDICATIONS: Outpatient Medications Prior to Visit  Medication Sig Dispense Refill  . acetaminophen (TYLENOL) 500 MG tablet Take 1,000 mg by mouth every 6 (six) hours as needed for moderate pain.     . cholecalciferol (VITAMIN D) 1000 UNITS tablet Take 1,000 Units by mouth daily.    Marland Kitchen EPINEPHrine 0.3 mg/0.3 mL IJ SOAJ injection Inject 0.3 mg into the muscle as needed for anaphylaxis.    . Garlic 500 MG CAPS Take 500 mg by mouth daily.    Marland Kitchen GLUCOSAMINE-CHONDROITIN PO Take 1,200 mg by mouth daily.    . hydroxypropyl methylcellulose / hypromellose (ISOPTO TEARS / GONIOVISC) 2.5 % ophthalmic solution Place 1 drop into both eyes 3 (three) times daily as needed for dry eyes.     Marland Kitchen ibuprofen (ADVIL,MOTRIN) 200 MG tablet Take 400 mg by mouth daily as needed for headache or moderate pain.     . Multiple Vitamins-Minerals (CENTRUM  SILVER PO) Take 1 tablet by mouth daily.    . Multiple Vitamins-Minerals (OCUVITE ADULT 50+) CAPS Take 1 capsule by mouth daily.    . simvastatin (ZOCOR) 40 MG tablet Take 40 mg by mouth every evening.     . loratadine (CLARITIN) 10 MG tablet Take 10 mg by mouth daily. (Patient not taking: Reported on 03/22/2020)    . aspirin EC 81 MG tablet Take 1 tablet (81 mg total) by mouth daily.     No facility-administered medications prior to visit.    PAST MEDICAL HISTORY: Past Medical History:  Diagnosis Date  . AAA (abdominal aortic aneurysm) ([facility])   . Arthritis    "fingers"  (02/09/2016)  . Basal cell carcinoma of cheek 2012  . Chronic low back pain    pt. denies  . DDD (degenerative disc disease), lumbar   . Headache   . History of kidney stones   . Hyperlipidemia   . Tremor     PAST SURGICAL HISTORY: Past Surgical History:  Procedure Laterality Date  . ABDOMINAL AORTIC ANEURYSM REPAIR    . ABDOMINAL AORTIC ENDOVASCULAR STENT GRAFT  02/09/2016  . ABDOMINAL AORTIC ENDOVASCULAR STENT GRAFT N/A 02/09/2016   Procedure: ABDOMINAL AORTIC ENDOVASCULAR STENT GRAFT;  Surgeon: Chuck Hint, MD;  Location: [facility] OR;  Service: Vascular;  Laterality: N/A;  . COLONOSCOPY    . MENISCUS REPAIR Right 1960s X  2  . MOHS SURGERY Bilateral 2020,2017   "cheeks"  . REVERSE SHOULDER ARTHROPLASTY Right 01/20/2019   Procedure: REVERSE SHOULDER ARTHROPLASTY;  Surgeon: Nicholes Stairs, MD;  Location: [place];  Service: Orthopedics;  Laterality: Right;  2.5 hrs  . TOOTH EXTRACTION  10/2012   "put me out to pull 1 tooth"    FAMILY HISTORY: Family History  Problem Relation Age of Onset  . Cancer Mother 62       Ovarian  . Heart disease Father 77       Endocarditis  . Other Brother        plane crash    SOCIAL HISTORY: Social History   Socioeconomic History  . Marital status: Widowed    Spouse name: Not on file  . Number of children: 2  . Years of education: Not on file  . Highest  education level: Bachelor's degree (e.g., BA, AB, BS)  Occupational History    Comment: retired  Tobacco Use  . Smoking status: Former Smoker    Packs/day: 2.50    Years: 33.00    Pack years: 82.50    Types: Cigarettes    Quit date: 03/26/1998    Years since quitting: 22.0  . Smokeless tobacco: Never Used  Vaping Use  . Vaping Use: Never used  Substance and Sexual Activity  . Alcohol use: Yes    Alcohol/week: 4.0 standard drinks    Types: 2 Glasses of wine, 2 Shots of liquor per week  . Drug use: No  . Sexual activity: Not Currently  Other Topics Concern  . Not on file  Social History Narrative   Lives alone   Caffeine- 2 c coffee   Social Determinants of Health   Financial Resource Strain: Not on file  Food Insecurity: Not on file  Transportation Needs: Not on file  Physical Activity: Not on file  Stress: Not on file  Social Connections: Not on file  Intimate Partner Violence: Not on file     PHYSICAL EXAM  GENERAL EXAM/CONSTITUTIONAL: Vitals:  Vitals:   03/22/20 1240  BP: (!) 169/91  Pulse: 71  Weight: 154 lb (69.9 kg)  Height: 5\' 5"  (1.651 m)   Body mass index is 25.63 kg/m. Wt Readings from Last 3 Encounters:  03/22/20 154 lb (69.9 kg)  05/06/19 152 lb (68.9 kg)  01/20/19 150 lb (68 kg)    Patient is in no distress; well developed, nourished and groomed; neck is supple  CARDIOVASCULAR:  Examination of carotid arteries is normal; no carotid bruits  Regular rate and rhythm, no murmurs  Examination of peripheral vascular system by observation and palpation is normal  EYES:  Ophthalmoscopic exam of optic discs and posterior segments is normal; no papilledema or hemorrhages No exam data present  MUSCULOSKELETAL:  Gait, [name], tone, movements noted in Neurologic exam below  NEUROLOGIC: MENTAL STATUS:  No flowsheet data found.  awake, alert, oriented to person, place and time  recent and remote memory intact  normal attention and  concentration  language fluent, comprehension intact, naming intact  fund of knowledge appropriate  CRANIAL NERVE:   2nd - no papilledema on fundoscopic exam  2nd, 3rd, 4th, 6th - pupils equal and reactive to light, visual fields full to confrontation, extraocular muscles intact, no nystagmus  5th - facial sensation symmetric  7th - facial [name] symmetric  8th - hearing intact  9th - palate elevates symmetrically, uvula midline  11th - shoulder shrug symmetric  12th - tongue protrusion midline  MILD HEAD TREMOR  MOTOR:   normal bulk and tone, full [name] in the BUE, BLE; MINIMAL POSTURAL TREMOR IN HANDS  SENSORY:   normal and symmetric to light touch, temperature, vibration  COORDINATION:   finger-nose-finger, fine finger movements normal  REFLEXES:   deep tendon reflexes present and symmetric  GAIT/STATION:   narrow based gait     DIAGNOSTIC DATA (LABS, IMAGING, TESTING) - I reviewed patient records, labs, notes, testing and imaging myself where available.  Lab Results  Component Value Date   WBC 8.1 01/20/2019   HGB 15.2 01/20/2019   HCT 45.5 01/20/2019   MCV 94.8 01/20/2019   PLT 240 01/20/2019      Component Value Date/Time   NA 137 01/20/2019 1038   K 3.7 01/20/2019 1038   CL 104 01/20/2019 1038   CO2 21 (L) 01/20/2019 1038   GLUCOSE 249 (H) 01/20/2019 1038   BUN 14 01/20/2019 1038   CREATININE 1.35 (H) 01/20/2019 1038   CREATININE 0.93 11/22/2014 0736   CALCIUM 9.3 01/20/2019 1038   PROT 7.1 02/01/2016 1122   ALBUMIN 4.1 02/01/2016 1122   AST 22 02/01/2016 1122   ALT 16 (L) 02/01/2016 1122   ALKPHOS 38 02/01/2016 1122   BILITOT 1.2 02/01/2016 1122   GFRNONAA 50 (L) 01/20/2019 1038   GFRAA 58 (L) 01/20/2019 1038   No results found for: CHOL, HDL, LDLCALC, LDLDIRECT, TRIG, CHOLHDL No results found for: UKGU5K No results found for: VITAMINB12 No results found for: TSH   08/21/19 MRI brain [I reviewed images myself and agree  with interpretation. -VRP]  1. 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 No abnormal contrast enhancement. There is thinning of the overlying brain parenchyma. 2. Generalized atrophy without lobar predilection.    ASSESSMENT AND PLAN  79 y.o. year old male here with mild postural head tremor, slightly noticed in the hands, since approximately year 2000.  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  Discussed options for treatment of tremor with medication and patient would like to try propranolol.  Dx:  1. Essential tremor     PLAN:  - propranolol 20mg  twice a day; may increase over time; monitor BP at home  Meds ordered this encounter  Medications  . propranolol (INDERAL) 20 MG tablet    Sig: Take 1 tablet (20 mg total) by mouth 2 (two) times daily.    Dispense:  60 tablet    Refill:  6   Return in about 6 months (around 09/20/2020).    09/22/2020, MD 03/22/2020, 1:40 PM Certified in Neurology, Neurophysiology and Neuroimaging  [facility] [address] [place], [place] [id number]

## 2020-03-22 NOTE — Patient Instructions (Signed)
-   propranolol 20mg  twice a day; may increase over time

## 2020-04-15 DIAGNOSIS — Z85828 Personal history of other malignant neoplasm of skin: Secondary | ICD-10-CM | POA: Diagnosis not present

## 2020-04-15 DIAGNOSIS — L570 Actinic keratosis: Secondary | ICD-10-CM | POA: Diagnosis not present

## 2020-05-11 ENCOUNTER — Other Ambulatory Visit: Payer: Self-pay

## 2020-05-11 ENCOUNTER — Encounter: Payer: Self-pay | Admitting: Vascular Surgery

## 2020-05-11 ENCOUNTER — Ambulatory Visit (HOSPITAL_COMMUNITY)
Admission: RE | Admit: 2020-05-11 | Discharge: 2020-05-11 | Disposition: A | Discharge status: Home / Self Care | Payer: PPO | Source: Ambulatory Visit | Attending: Vascular Surgery | Admitting: Vascular Surgery

## 2020-05-11 ENCOUNTER — Ambulatory Visit (INDEPENDENT_AMBULATORY_CARE_PROVIDER_SITE_OTHER): Payer: PPO | Admitting: Vascular Surgery

## 2020-05-11 VITALS — BP 148/86 | HR 51 | Temp 98.8°F | Resp 20 | Ht 65.0 in | Wt 153.0 lb

## 2020-05-11 DIAGNOSIS — I714 Abdominal aortic aneurysm, without rupture, unspecified: Secondary | ICD-10-CM

## 2020-05-11 NOTE — Progress Notes (Signed)
REASON FOR VISIT:   Follow-up after endovascular aneurysm repair  MEDICAL ISSUES:   STATUS POST EVAR: This patient underwent repair of 5.8 cm infrarenal abdominal aortic aneurysm in 2017.  His aneurysm remains stable at the size at 5.5 cm.  He remains very active.  He is on a statin.  He had stopped taking aspirin but I have encouraged him to resume 81 mg of aspirin daily.  There is no evidence of endoleak on his duplex.  I have ordered a follow-up duplex scan in 1 year I will see him back at that time.  He knows to call sooner if he has problems.  He is not a smoker.   HPI:   [name] [name] is a pleasant 80 y.o. male who comes in for follow-up of an endovascular aneurysm repair.  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  At the time of his last visit the aneurysm measured 5.5 cm in maximum diameter.  He comes in for routine follow-up visit.  He has no specific complaints.  He denies abdominal pain, claudication, rest pain, or nonhealing ulcers.  Unfortunately he lost his wife this year.  They have been married for 50 years.  He is doing well.  He has 4 grandchildren.  Past Medical History:  Diagnosis Date  . AAA (abdominal aortic aneurysm) ([place])   . Arthritis    "fingers"  (02/09/2016)  . Basal cell carcinoma of cheek 2012  . Chronic low back pain    pt. denies  . DDD (degenerative disc disease), lumbar   . Headache   . History of kidney stones   . Hyperlipidemia   . Tremor     Family History  Problem Relation Age of Onset  . Cancer Mother 20       Ovarian  . Heart disease Father 36       Endocarditis  . Other Brother        plane crash    SOCIAL HISTORY: Social History   Tobacco Use  . Smoking status: Former Smoker    Packs/day: 2.50    Years: 33.00    Pack years: 82.50    Types: Cigarettes    Quit date: 03/26/1998    Years since quitting: 22.1  . Smokeless tobacco: Never Used  Substance Use Topics  . Alcohol  use: Yes    Alcohol/week: 4.0 standard drinks    Types: 2 Glasses of wine, 2 Shots of liquor per week    Allergies  Allergen Reactions  . Bee Venom Hives, Swelling and Rash    Yellow jackets     Current Outpatient Medications  Medication Sig Dispense Refill  . acetaminophen (TYLENOL) 500 MG tablet Take 1,000 mg by mouth every 6 (six) hours as needed for moderate pain.     . cholecalciferol (VITAMIN D) 1000 UNITS tablet Take 1,000 Units by mouth daily.    Marland Kitchen EPINEPHrine 0.3 mg/0.3 mL IJ SOAJ injection Inject 0.3 mg into the muscle as needed for anaphylaxis.    . Garlic 545 MG CAPS Take 500 mg by mouth daily.    Marland Kitchen GLUCOSAMINE-CHONDROITIN PO Take 1,200 mg by mouth daily.    . hydroxypropyl methylcellulose / hypromellose (ISOPTO TEARS / GONIOVISC) 2.5 % ophthalmic solution Place 1 drop into both eyes 3 (three) times daily as needed for dry eyes.     Marland Kitchen ibuprofen (ADVIL,MOTRIN) 200 MG tablet Take 400 mg by mouth daily as needed for headache or  moderate pain.     Marland Kitchen loratadine (CLARITIN) 10 MG tablet Take 10 mg by mouth daily as needed.    . Multiple Vitamins-Minerals (CENTRUM SILVER PO) Take 1 tablet by mouth daily.    . Multiple Vitamins-Minerals (OCUVITE ADULT 50+) CAPS Take 1 capsule by mouth daily.    . propranolol (INDERAL) 20 MG tablet Take 1 tablet (20 mg total) by mouth 2 (two) times daily. 60 tablet 6  . simvastatin (ZOCOR) 40 MG tablet Take 40 mg by mouth every evening.      No current facility-administered medications for this visit.    REVIEW OF SYSTEMS:  [X]  denotes positive finding, [ ]  denotes negative finding Cardiac  Comments:  Chest pain or chest pressure:    Shortness of breath upon exertion:    Short of breath when lying flat:    Irregular heart rhythm:        Vascular    Pain in calf, thigh, or hip brought on by ambulation:    Pain in feet at night that wakes you up from your sleep:     Blood clot in your veins:    Leg swelling:         Pulmonary    Oxygen at  home:    Productive cough:     Wheezing:         Neurologic    Sudden weakness in arms or legs:     Sudden numbness in arms or legs:     Sudden onset of difficulty speaking or slurred speech:    Temporary loss of vision in one eye:     Problems with dizziness:         Gastrointestinal    Blood in stool:     Vomited blood:         Genitourinary    Burning when urinating:     Blood in urine:        Psychiatric    Major depression:         Hematologic    Bleeding problems:    Problems with blood clotting too easily:        Skin    Rashes or ulcers:        Constitutional    Fever or chills:     PHYSICAL EXAM:   Vitals:   05/11/20 0832  BP: (!) 148/86  Pulse: (!) 51  Resp: 20  Temp: 98.8 F (37.1 C)  SpO2: 98%  Weight: 69.4 kg  Height: 5\' 5"  (1.651 m)    GENERAL: The patient is a well-nourished male, in no acute distress. The vital signs are documented above. CARDIAC: There is a regular rate and rhythm.  VASCULAR: I do not detect any carotid bruits. He has palpable pedal pulses. PULMONARY: There is good air exchange bilaterally without wheezing or rales. ABDOMEN: Soft and non-tender with normal pitched bowel sounds.  MUSCULOSKELETAL: There are no major deformities or cyanosis. NEUROLOGIC: No focal weakness or paresthesias are detected. SKIN: There are no ulcers or rashes noted. PSYCHIATRIC: The patient has a normal affect.  DATA:    DUPLEX ABDOMINAL AORTA: I have independently interpreted the duplex of his abdominal aortic aneurysm.  His endovascular repair is tacked with no evidence of endoleak.  The maximum diameter of the aneurysm is 5.5 cm.  This is unchanged compared to study a year ago.  The aorta was initially 5.8 cm in diameter prior to repair.  Deitra Mayo [facility] [phone]

## 2020-05-14 DIAGNOSIS — Z03818 Encounter for observation for suspected exposure to other biological agents ruled out: Secondary | ICD-10-CM | POA: Diagnosis not present

## 2020-05-14 DIAGNOSIS — Z20822 Contact with and (suspected) exposure to covid-19: Secondary | ICD-10-CM | POA: Diagnosis not present

## 2020-06-01 DIAGNOSIS — R194 Change in bowel habit: Secondary | ICD-10-CM | POA: Diagnosis not present

## 2020-06-06 DIAGNOSIS — L570 Actinic keratosis: Secondary | ICD-10-CM | POA: Diagnosis not present

## 2020-06-06 DIAGNOSIS — L814 Other melanin hyperpigmentation: Secondary | ICD-10-CM | POA: Diagnosis not present

## 2020-06-06 DIAGNOSIS — D225 Melanocytic nevi of trunk: Secondary | ICD-10-CM | POA: Diagnosis not present

## 2020-06-06 DIAGNOSIS — Z85828 Personal history of other malignant neoplasm of skin: Secondary | ICD-10-CM | POA: Diagnosis not present

## 2020-06-06 DIAGNOSIS — L821 Other seborrheic keratosis: Secondary | ICD-10-CM | POA: Diagnosis not present

## 2020-07-05 ENCOUNTER — Other Ambulatory Visit: Payer: Self-pay | Admitting: Family Medicine

## 2020-07-05 DIAGNOSIS — R197 Diarrhea, unspecified: Secondary | ICD-10-CM

## 2020-07-12 DIAGNOSIS — R197 Diarrhea, unspecified: Secondary | ICD-10-CM | POA: Diagnosis not present

## 2020-07-18 DIAGNOSIS — D040 Carcinoma in situ of skin of lip: Secondary | ICD-10-CM | POA: Diagnosis not present

## 2020-07-18 DIAGNOSIS — D485 Neoplasm of uncertain behavior of skin: Secondary | ICD-10-CM | POA: Diagnosis not present

## 2020-07-18 DIAGNOSIS — Z85828 Personal history of other malignant neoplasm of skin: Secondary | ICD-10-CM | POA: Diagnosis not present

## 2020-07-25 ENCOUNTER — Ambulatory Visit
Admission: RE | Admit: 2020-07-25 | Discharge: 2020-07-25 | Disposition: A | Discharge status: Home / Self Care | Payer: PPO | Source: Ambulatory Visit | Attending: Family Medicine | Admitting: Family Medicine

## 2020-07-25 DIAGNOSIS — I714 Abdominal aortic aneurysm, without rupture: Secondary | ICD-10-CM | POA: Diagnosis not present

## 2020-07-25 DIAGNOSIS — R197 Diarrhea, unspecified: Secondary | ICD-10-CM | POA: Diagnosis not present

## 2020-07-25 DIAGNOSIS — K760 Fatty (change of) liver, not elsewhere classified: Secondary | ICD-10-CM | POA: Diagnosis not present

## 2020-07-28 DIAGNOSIS — Z20822 Contact with and (suspected) exposure to covid-19: Secondary | ICD-10-CM | POA: Diagnosis not present

## 2020-08-09 DIAGNOSIS — J301 Allergic rhinitis due to pollen: Secondary | ICD-10-CM | POA: Diagnosis not present

## 2020-08-09 DIAGNOSIS — G250 Essential tremor: Secondary | ICD-10-CM | POA: Diagnosis not present

## 2020-08-09 DIAGNOSIS — Z0000 Encounter for general adult medical examination without abnormal findings: Secondary | ICD-10-CM | POA: Diagnosis not present

## 2020-08-09 DIAGNOSIS — G930 Cerebral cysts: Secondary | ICD-10-CM | POA: Diagnosis not present

## 2020-08-09 DIAGNOSIS — Z9889 Other specified postprocedural states: Secondary | ICD-10-CM | POA: Diagnosis not present

## 2020-08-09 DIAGNOSIS — A0472 Enterocolitis due to Clostridium difficile, not specified as recurrent: Secondary | ICD-10-CM | POA: Diagnosis not present

## 2020-08-09 DIAGNOSIS — R7301 Impaired fasting glucose: Secondary | ICD-10-CM | POA: Diagnosis not present

## 2020-08-09 DIAGNOSIS — E782 Mixed hyperlipidemia: Secondary | ICD-10-CM | POA: Diagnosis not present

## 2020-09-21 ENCOUNTER — Ambulatory Visit: Payer: TRICARE For Life (TFL) | Admitting: Diagnostic Neuroimaging

## 2020-10-10 ENCOUNTER — Ambulatory Visit: Payer: TRICARE For Life (TFL) | Admitting: Diagnostic Neuroimaging

## 2020-11-20 ENCOUNTER — Other Ambulatory Visit: Payer: Self-pay | Admitting: Diagnostic Neuroimaging

## 2020-11-21 DIAGNOSIS — M65332 Trigger finger, left middle finger: Secondary | ICD-10-CM | POA: Diagnosis not present

## 2020-11-21 DIAGNOSIS — M79642 Pain in left hand: Secondary | ICD-10-CM | POA: Diagnosis not present

## 2020-11-21 DIAGNOSIS — M13849 Other specified arthritis, unspecified hand: Secondary | ICD-10-CM | POA: Diagnosis not present

## 2020-11-22 ENCOUNTER — Other Ambulatory Visit: Payer: Self-pay

## 2020-11-22 ENCOUNTER — Encounter: Payer: Self-pay | Admitting: Diagnostic Neuroimaging

## 2020-11-22 ENCOUNTER — Ambulatory Visit (INDEPENDENT_AMBULATORY_CARE_PROVIDER_SITE_OTHER): Payer: PPO | Admitting: Diagnostic Neuroimaging

## 2020-11-22 VITALS — BP 132/76 | HR 55 | Ht 65.5 in | Wt 154.6 lb

## 2020-11-22 DIAGNOSIS — G250 Essential tremor: Secondary | ICD-10-CM

## 2020-11-22 MED ORDER — PROPRANOLOL HCL 20 MG PO TABS: 20.0000 mg | ORAL_TABLET | Freq: Two times a day (BID) | ORAL | 12 refills | Status: DC

## 2020-11-22 NOTE — Progress Notes (Signed)
[facility]  PATIENT: [name] ZABALETA DOB: [birth year]  REFERRING CLINICIAN: Mayra Neer, MD HISTORY FROM: patient  REASON FOR VISIT: follow up   HISTORICAL  CHIEF COMPLAINT:  Chief Complaint  Patient presents with   Essential Tremor    Rm 6,  6 month FU  "no noticeable change in my tremor"    HISTORY OF PRESENT ILLNESS:   UPDATE (11/22/20, [facility]P): Since last visit, doing well. Tried propranolol and no major changes. Tremor overall very mild. No changes in ADLs.   PRIOR HPI (03/22/20): 80 year old male here for evaluation of tremor.  Symptoms started more than 20 years ago, gradual onset progressive head tremor.  No specific triggering aggravating factors.  No family history of tremor.  Symptoms noticed by other people and by himself.  He has not noticed any tremor in his arms or voice.  No gait or balance difficulties.  No neck issues.  He has had a left lateral intraventricular cyst that has been monitored over time without any significant change or symptoms.     REVIEW OF SYSTEMS: Full 14 system review of systems performed and negative with exception of: As per HPI.  ALLERGIES: Allergies  Allergen Reactions   Bee Venom Hives, Swelling and Rash    Yellow jackets     HOME MEDICATIONS: Outpatient Medications Prior to Visit  Medication Sig Dispense Refill   acetaminophen (TYLENOL) 500 MG tablet Take 1,000 mg by mouth every 6 (six) hours as needed for moderate pain.      cholecalciferol (VITAMIN D) 1000 UNITS tablet Take 1,000 Units by mouth daily.     EPINEPHrine 0.3 mg/0.3 mL IJ SOAJ injection Inject 0.3 mg into the muscle as needed for anaphylaxis.     Garlic [id number] MG CAPS Take 500 mg by mouth daily.     GLUCOSAMINE-CHONDROITIN PO Take 1,200 mg by mouth daily.     ibuprofen (ADVIL,MOTRIN) 200 MG tablet Take 400 mg by mouth daily as needed for headache or moderate pain.      loratadine (CLARITIN) 10 MG tablet Take 10 mg by mouth daily as needed.      Multiple Vitamins-Minerals (CENTRUM SILVER PO) Take 1 tablet by mouth daily.     Multiple Vitamins-Minerals (OCUVITE ADULT 50+) CAPS Take 1 capsule by mouth daily.     Polyethyl Glycol-Propyl Glycol (SYSTANE ULTRA OP) Apply to eye as needed.     simvastatin (ZOCOR) 40 MG tablet Take 40 mg by mouth every evening.      OVER THE COUNTER MEDICATION OTC eye drops for dry eye     propranolol (INDERAL) 20 MG tablet Take 1 tablet (20 mg total) by mouth 2 (two) times daily. 60 tablet 6   hydroxypropyl methylcellulose / hypromellose (ISOPTO TEARS / GONIOVISC) 2.5 % ophthalmic solution Place 1 drop into both eyes 3 (three) times daily as needed for dry eyes.      No facility-administered medications prior to visit.    PAST MEDICAL HISTORY: Past Medical History:  Diagnosis Date   AAA (abdominal aortic aneurysm) ([place])    Arthritis    "fingers"  (02/09/2016)   Basal cell carcinoma of cheek 2012   Chronic low back pain    pt. denies   DDD (degenerative disc disease), lumbar    Headache    History of kidney stones    Hyperlipidemia    Tremor    Trigger finger    left hand    PAST SURGICAL HISTORY: Past Surgical History:  Procedure Laterality Date  ABDOMINAL AORTIC ANEURYSM REPAIR     ABDOMINAL AORTIC ENDOVASCULAR STENT GRAFT  02/09/2016   ABDOMINAL AORTIC ENDOVASCULAR STENT GRAFT N/A 02/09/2016   Procedure: ABDOMINAL AORTIC ENDOVASCULAR STENT GRAFT;  Surgeon: Angelia Mould, MD;  Location: [facility];  Service: Vascular;  Laterality: N/A;   COLONOSCOPY     MENISCUS REPAIR Right 1960s X 2   MOHS SURGERY Bilateral 2020,2017   "cheeks"   REVERSE SHOULDER ARTHROPLASTY Right 01/20/2019   Procedure: REVERSE SHOULDER ARTHROPLASTY;  Surgeon: Nicholes Stairs, MD;  Location: [facility];  Service: Orthopedics;  Laterality: Right;  2.5 hrs   TOOTH EXTRACTION  10/2012   "put me out to pull 1 tooth"    FAMILY HISTORY: Family History  Problem Relation Age of Onset   Cancer Mother 106        Ovarian   Heart disease Father 26       Endocarditis   Other Brother        plane crash    SOCIAL HISTORY: Social History   Socioeconomic History   Marital status: Widowed    Spouse name: Not on file   Number of children: 2   Years of education: Not on file   Highest education level: Bachelor's degree (e.g., BA, AB, BS)  Occupational History    Comment: retired  Tobacco Use   Smoking status: Former    Packs/day: 2.50    Years: 33.00    Pack years: 82.50    Types: Cigarettes    Quit date: 03/26/1998    Years since quitting: 22.6   Smokeless tobacco: Never  Vaping Use   Vaping Use: Never used  Substance and Sexual Activity   Alcohol use: Yes    Alcohol/week: 4.0 standard drinks    Types: 2 Glasses of wine, 2 Shots of liquor per week   Drug use: No   Sexual activity: Not Currently  Other Topics Concern   Not on file  Social History Narrative   Lives alone   Caffeine- 2 c coffee   Social Determinants of Health   Financial Resource Strain: Not on file  Food Insecurity: Not on file  Transportation Needs: Not on file  Physical Activity: Not on file  Stress: Not on file  Social Connections: Not on file  Intimate Partner Violence: Not on file     PHYSICAL EXAM  GENERAL EXAM/CONSTITUTIONAL: Vitals:  Vitals:   11/22/20 0904  BP: 132/76  Pulse: (!) 55  Weight: 154 lb 9.6 oz (70.1 kg)  Height: 5' 5.5" (1.664 m)   Body mass index is 25.34 kg/m. Wt Readings from Last 3 Encounters:  11/22/20 154 lb 9.6 oz (70.1 kg)  05/11/20 153 lb (69.4 kg)  03/22/20 154 lb (69.9 kg)   Patient is in no distress; well developed, nourished and groomed; neck is supple  CARDIOVASCULAR: Examination of carotid arteries is normal; no carotid bruits Regular rate and rhythm, no murmurs Examination of peripheral vascular system by observation and palpation is normal  EYES: Ophthalmoscopic exam of optic discs and posterior segments is normal; no papilledema or hemorrhages No  results found.  MUSCULOSKELETAL: Gait, strength, tone, movements noted in Neurologic exam below  NEUROLOGIC: MENTAL STATUS:  No flowsheet data found. awake, alert, oriented to person, place and time recent and remote memory intact normal attention and concentration language fluent, comprehension intact, naming intact fund of knowledge appropriate  CRANIAL NERVE:  2nd - no papilledema on fundoscopic exam 2nd, 3rd, 4th, 6th - pupils equal and reactive to light,  visual fields full to confrontation, extraocular muscles intact, no nystagmus 5th - facial sensation symmetric 7th - facial strength symmetric 8th - hearing intact 9th - palate elevates symmetrically, uvula midline 11th - shoulder shrug symmetric 12th - tongue protrusion midline NO HEAD TREMOR  MOTOR:  normal bulk and tone, full strength in the BUE, BLE; RARE  POSTURAL TREMOR IN LUE  SENSORY:  normal and symmetric to light touch, temperature, vibration  COORDINATION:  finger-nose-finger, fine finger movements normal  REFLEXES:  deep tendon reflexes present and symmetric  GAIT/STATION:  narrow based gait     DIAGNOSTIC DATA (LABS, IMAGING, TESTING) - I reviewed patient records, labs, notes, testing and imaging myself where available.  Lab Results  Component Value Date   WBC 8.1 01/20/2019   HGB 15.2 01/20/2019   HCT 45.5 01/20/2019   MCV 94.8 01/20/2019   PLT 240 01/20/2019      Component Value Date/Time   NA 137 01/20/2019 1038   K 3.7 01/20/2019 1038   CL 104 01/20/2019 1038   CO2 21 (L) 01/20/2019 1038   GLUCOSE 249 (H) 01/20/2019 1038   BUN 14 01/20/2019 1038   CREATININE 1.35 (H) 01/20/2019 1038   CREATININE 0.93 11/22/2014 0736   CALCIUM 9.3 01/20/2019 1038   PROT 7.1 02/01/2016 1122   ALBUMIN 4.1 02/01/2016 1122   AST 22 02/01/2016 1122   ALT 16 (L) 02/01/2016 1122   ALKPHOS 38 02/01/2016 1122   BILITOT 1.2 02/01/2016 1122   GFRNONAA 50 (L) 01/20/2019 1038   GFRAA 58 (L) 01/20/2019  1038   No results found for: CHOL, HDL, LDLCALC, LDLDIRECT, TRIG, CHOLHDL No results found for: HGBA1C No results found for: VITAMINB12 No results found for: TSH   08/21/19 MRI brain [I reviewed images myself and agree with interpretation. -[facility]P]  1. 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 No abnormal contrast enhancement. There is thinning of the overlying brain parenchyma. 2. Generalized atrophy without lobar predilection.    ASSESSMENT AND PLAN  80 y.o. year old male here with mild postural head tremor, slightly noticed in the hands, since approximately year 2000.  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  Discussed options for treatment of tremor with medication and patient would like to try propranolol.  Dx:  1. Essential tremor      PLAN:  ESSENTIAL TREMOR - I think some mild benefit with propranolol; but sxs so mild, ok to try coming of to see if necessary; tremor not affecting his ADLs that much.  - will refill for 1 year if he decides to stay on long term; future refills per PCP if they agree  Meds ordered this encounter  Medications   propranolol (INDERAL) 20 MG tablet    Sig: Take 1 tablet (20 mg total) by mouth 2 (two) times daily.    Dispense:  60 tablet    Refill:  12   Return for pending if symptoms worsen or fail to improve, return to PCP.    Penni Bombard, MD [id number], [id number] Certified in Neurology, Neurophysiology and Neuroimaging  [facility] [address], [place], [place] [address] [phone]

## 2020-11-22 NOTE — Patient Instructions (Signed)
  ESSENTIAL TREMOR - some mild benefit with propranolol; but sxs so mild, ok to try coming off to see if necessary

## 2020-12-15 DIAGNOSIS — Z85828 Personal history of other malignant neoplasm of skin: Secondary | ICD-10-CM | POA: Diagnosis not present

## 2020-12-15 DIAGNOSIS — L821 Other seborrheic keratosis: Secondary | ICD-10-CM | POA: Diagnosis not present

## 2020-12-15 DIAGNOSIS — L570 Actinic keratosis: Secondary | ICD-10-CM | POA: Diagnosis not present

## 2020-12-15 DIAGNOSIS — D485 Neoplasm of uncertain behavior of skin: Secondary | ICD-10-CM | POA: Diagnosis not present

## 2020-12-15 DIAGNOSIS — C44319 Basal cell carcinoma of skin of other parts of face: Secondary | ICD-10-CM | POA: Diagnosis not present

## 2020-12-15 DIAGNOSIS — D2261 Melanocytic nevi of right upper limb, including shoulder: Secondary | ICD-10-CM | POA: Diagnosis not present

## 2020-12-15 DIAGNOSIS — D2262 Melanocytic nevi of left upper limb, including shoulder: Secondary | ICD-10-CM | POA: Diagnosis not present

## 2020-12-26 DIAGNOSIS — M13849 Other specified arthritis, unspecified hand: Secondary | ICD-10-CM | POA: Diagnosis not present

## 2020-12-26 DIAGNOSIS — M79642 Pain in left hand: Secondary | ICD-10-CM | POA: Diagnosis not present

## 2020-12-26 DIAGNOSIS — M65332 Trigger finger, left middle finger: Secondary | ICD-10-CM | POA: Diagnosis not present

## 2021-01-06 DIAGNOSIS — M79672 Pain in left foot: Secondary | ICD-10-CM | POA: Diagnosis not present

## 2021-01-06 DIAGNOSIS — M79671 Pain in right foot: Secondary | ICD-10-CM | POA: Diagnosis not present

## 2021-01-06 DIAGNOSIS — G609 Hereditary and idiopathic neuropathy, unspecified: Secondary | ICD-10-CM | POA: Diagnosis not present

## 2021-01-12 DIAGNOSIS — Z85828 Personal history of other malignant neoplasm of skin: Secondary | ICD-10-CM | POA: Diagnosis not present

## 2021-01-12 DIAGNOSIS — C44319 Basal cell carcinoma of skin of other parts of face: Secondary | ICD-10-CM | POA: Diagnosis not present

## 2021-02-28 DIAGNOSIS — H25813 Combined forms of age-related cataract, bilateral: Secondary | ICD-10-CM | POA: Diagnosis not present

## 2021-02-28 DIAGNOSIS — H52203 Unspecified astigmatism, bilateral: Secondary | ICD-10-CM | POA: Diagnosis not present

## 2021-02-28 DIAGNOSIS — H04123 Dry eye syndrome of bilateral lacrimal glands: Secondary | ICD-10-CM | POA: Diagnosis not present

## 2021-02-28 DIAGNOSIS — H524 Presbyopia: Secondary | ICD-10-CM | POA: Diagnosis not present

## 2021-03-03 ENCOUNTER — Ambulatory Visit
Admission: RE | Admit: 2021-03-03 | Discharge: 2021-03-03 | Disposition: A | Discharge status: Home / Self Care | Payer: PPO | Source: Ambulatory Visit | Attending: Family Medicine | Admitting: Family Medicine

## 2021-03-03 ENCOUNTER — Other Ambulatory Visit: Payer: Self-pay | Admitting: Family Medicine

## 2021-03-03 DIAGNOSIS — Z8619 Personal history of other infectious and parasitic diseases: Secondary | ICD-10-CM | POA: Diagnosis not present

## 2021-03-03 DIAGNOSIS — R197 Diarrhea, unspecified: Secondary | ICD-10-CM

## 2021-03-09 DIAGNOSIS — R197 Diarrhea, unspecified: Secondary | ICD-10-CM | POA: Diagnosis not present

## 2021-03-30 DIAGNOSIS — R140 Abdominal distension (gaseous): Secondary | ICD-10-CM | POA: Diagnosis not present

## 2021-03-30 DIAGNOSIS — R197 Diarrhea, unspecified: Secondary | ICD-10-CM | POA: Diagnosis not present

## 2021-03-31 DIAGNOSIS — R197 Diarrhea, unspecified: Secondary | ICD-10-CM | POA: Diagnosis not present

## 2021-03-31 DIAGNOSIS — A0472 Enterocolitis due to Clostridium difficile, not specified as recurrent: Secondary | ICD-10-CM | POA: Diagnosis not present

## 2021-04-11 DIAGNOSIS — A0472 Enterocolitis due to Clostridium difficile, not specified as recurrent: Secondary | ICD-10-CM | POA: Diagnosis not present

## 2021-05-04 ENCOUNTER — Other Ambulatory Visit: Payer: Self-pay

## 2021-05-04 DIAGNOSIS — I714 Abdominal aortic aneurysm, without rupture, unspecified: Secondary | ICD-10-CM

## 2021-05-11 ENCOUNTER — Ambulatory Visit: Payer: PPO | Admitting: Vascular Surgery

## 2021-05-11 ENCOUNTER — Ambulatory Visit (HOSPITAL_COMMUNITY)
Admission: RE | Admit: 2021-05-11 | Discharge: 2021-05-11 | Disposition: A | Discharge status: Home / Self Care | Payer: PPO | Source: Ambulatory Visit | Attending: Vascular Surgery | Admitting: Vascular Surgery

## 2021-05-11 ENCOUNTER — Other Ambulatory Visit: Payer: Self-pay

## 2021-05-11 DIAGNOSIS — I714 Abdominal aortic aneurysm, without rupture, unspecified: Secondary | ICD-10-CM | POA: Diagnosis not present

## 2021-05-25 ENCOUNTER — Other Ambulatory Visit: Payer: Self-pay

## 2021-05-25 ENCOUNTER — Ambulatory Visit (INDEPENDENT_AMBULATORY_CARE_PROVIDER_SITE_OTHER): Payer: PPO | Admitting: Vascular Surgery

## 2021-05-25 ENCOUNTER — Encounter: Payer: Self-pay | Admitting: Vascular Surgery

## 2021-05-25 VITALS — BP 124/76 | HR 48 | Temp 98.2°F | Resp 20 | Ht 65.0 in | Wt 151.0 lb

## 2021-05-25 DIAGNOSIS — I714 Abdominal aortic aneurysm, without rupture, unspecified: Secondary | ICD-10-CM | POA: Diagnosis not present

## 2021-05-25 DIAGNOSIS — Z48812 Encounter for surgical aftercare following surgery on the circulatory system: Secondary | ICD-10-CM

## 2021-05-25 NOTE — Progress Notes (Signed)
? ? ?REASON FOR VISIT:  ? ?Follow-up after endovascular aneurysm repair ? ?MEDICAL ISSUES:  ? ?S/P ENDOVASCULAR ANEURYSM REPAIR: Patient is doing well status post endovascular aneurysm repair.  The aneurysm has continued to decrease in size and is now 5.2 cm in maximum diameter.  He is not a smoker.  He is on aspirin and is on a statin.  He is very active.  I have ordered a follow-up duplex scan in 1 year and I will see him back at that time.  He knows to call sooner if he has problems. ? ?HPI:  ? ?[name] [name] is a pleasant 81 y.o. male who I last saw on 05/11/2020.  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  At the time of his last visit his aneurysm was stable in size at 5.5 cm.  He was on aspirin and was on a statin.  I ordered a follow-up duplex scan in 1 year. ? ?Since I saw him last he denies any abdominal pain or back pain.  There have been no significant changes to his medical history.  He is on aspirin and is on a statin. ? ?Past Medical History:  ?Diagnosis Date  ? AAA (abdominal aortic aneurysm)   ? Arthritis   ? "fingers"  (02/09/2016)  ? Basal cell carcinoma of cheek 2012  ? Chronic low back pain   ? pt. denies  ? DDD (degenerative disc disease), lumbar   ? Headache   ? History of kidney stones   ? Hyperlipidemia   ? Tremor   ? Trigger finger   ? left hand  ? ? ?Family History  ?Problem Relation Age of Onset  ? Cancer Mother 19  ?     Ovarian  ? Heart disease Father 77  ?     Endocarditis  ? Other Brother   ?     plane crash  ? ? ?SOCIAL HISTORY: ?Social History  ? ?Tobacco Use  ? Smoking status: Former  ?  Packs/day: 2.50  ?  Years: 33.00  ?  Pack years: 82.50  ?  Types: Cigarettes  ?  Quit date: 03/26/1998  ?  Years since quitting: 23.1  ? Smokeless tobacco: Never  ?Substance Use Topics  ? Alcohol use: Yes  ?  Alcohol/week: 4.0 standard drinks  ?  Types: 2 Glasses of wine, 2 Shots of liquor per week  ? ? ?Allergies  ?Allergen Reactions  ? Bee Venom  Hives, Swelling and Rash  ?  Yellow jackets ?  ? ? ?Current Outpatient Medications  ?Medication Sig Dispense Refill  ? acetaminophen (TYLENOL) 500 MG tablet Take 1,000 mg by mouth every 6 (six) hours as needed for moderate pain.     ? cholecalciferol (VITAMIN D) 1000 UNITS tablet Take 1,000 Units by mouth daily.    ? EPINEPHrine 0.3 mg/0.3 mL IJ SOAJ injection Inject 0.3 mg into the muscle as needed for anaphylaxis.    ? Garlic 601 MG CAPS Take 500 mg by mouth daily.    ? GLUCOSAMINE-CHONDROITIN PO Take 1,200 mg by mouth daily.    ? ibuprofen (ADVIL,MOTRIN) 200 MG tablet Take 400 mg by mouth daily as needed for headache or moderate pain.     ? loratadine (CLARITIN) 10 MG tablet Take 10 mg by mouth daily as needed.    ? Multiple Vitamins-Minerals (CENTRUM SILVER PO) Take 1 tablet by mouth daily.    ? Multiple Vitamins-Minerals (OCUVITE ADULT 50+) CAPS Take  1 capsule by mouth daily.    ? Polyethyl Glycol-Propyl Glycol (SYSTANE ULTRA OP) Apply to eye as needed.    ? propranolol (INDERAL) 20 MG tablet Take 1 tablet (20 mg total) by mouth 2 (two) times daily. 60 tablet 12  ? simvastatin (ZOCOR) 40 MG tablet Take 40 mg by mouth every evening.     ? ?No current facility-administered medications for this visit.  ? ? ?REVIEW OF SYSTEMS:  ?[X]  denotes positive finding, [ ]  denotes negative finding ?Cardiac  Comments:  ?Chest pain or chest pressure:    ?Shortness of breath upon exertion:    ?Short of breath when lying flat:    ?Irregular heart rhythm:    ?    ?Vascular    ?Pain in calf, thigh, or hip brought on by ambulation:    ?Pain in feet at night that wakes you up from your sleep:     ?Blood clot in your veins:    ?Leg swelling:     ?    ?Pulmonary    ?Oxygen at home:    ?Productive cough:     ?Wheezing:     ?    ?Neurologic    ?Sudden weakness in arms or legs:     ?Sudden numbness in arms or legs:     ?Sudden onset of difficulty speaking or slurred speech:    ?Temporary loss of vision in one eye:     ?Problems with  dizziness:     ?    ?Gastrointestinal    ?Blood in stool:     ?Vomited blood:     ?    ?Genitourinary    ?Burning when urinating:     ?Blood in urine:    ?    ?Psychiatric    ?Major depression:     ?    ?Hematologic    ?Bleeding problems:    ?Problems with blood clotting too easily:    ?    ?Skin    ?Rashes or ulcers:    ?    ?Constitutional    ?Fever or chills:    ? ?PHYSICAL EXAM:  ? ?Vitals:  ? 05/25/21 0902  ?BP: 124/76  ?Pulse: (!) 48  ?Resp: 20  ?Temp: 98.2 ?F (36.8 ?C)  ?SpO2: 98%  ?Weight: 151 lb (68.5 kg)  ?Height: 5\' 5"  (1.651 m)  ? ? ?GENERAL: The patient is a well-nourished male, in no acute distress. The vital signs are documented above. ?CARDIAC: There is a regular rate and rhythm.  ?VASCULAR: I do not detect carotid bruits. ?He has palpable femoral and pedal pulses bilaterally. ?He has no significant lower extremity swelling. ?PULMONARY: There is good air exchange bilaterally without wheezing or rales. ?ABDOMEN: Soft and non-tender with normal pitched bowel sounds.  ?MUSCULOSKELETAL: There are no major deformities or cyanosis. ?NEUROLOGIC: No focal weakness or paresthesias are detected. ?SKIN: There are no ulcers or rashes noted. ?PSYCHIATRIC: The patient has a normal affect. ? ?DATA:   ? ?DUPLEX ABDOMINAL AORTA: I have reviewed the images of the duplex of his abdominal aorta.  The maximum diameter of his aneurysm was 5.2 cm.  This is slightly smaller than 5.5 cm a year ago.  There is no evidence of endoleak. ? ?Deitra Mayo ?[facility] ?Office [phone] ?

## 2021-06-15 DIAGNOSIS — L821 Other seborrheic keratosis: Secondary | ICD-10-CM | POA: Diagnosis not present

## 2021-06-15 DIAGNOSIS — D225 Melanocytic nevi of trunk: Secondary | ICD-10-CM | POA: Diagnosis not present

## 2021-06-15 DIAGNOSIS — Z85828 Personal history of other malignant neoplasm of skin: Secondary | ICD-10-CM | POA: Diagnosis not present

## 2021-06-15 DIAGNOSIS — C44319 Basal cell carcinoma of skin of other parts of face: Secondary | ICD-10-CM | POA: Diagnosis not present

## 2021-06-15 DIAGNOSIS — D692 Other nonthrombocytopenic purpura: Secondary | ICD-10-CM | POA: Diagnosis not present

## 2021-06-15 DIAGNOSIS — L570 Actinic keratosis: Secondary | ICD-10-CM | POA: Diagnosis not present

## 2021-06-15 DIAGNOSIS — D485 Neoplasm of uncertain behavior of skin: Secondary | ICD-10-CM | POA: Diagnosis not present

## 2021-07-12 IMAGING — DX DG SHOULDER 2+V PORT*R*
1 series · 1 of 1 positions shown · non-contrast
Comparison: Right shoulder CT 10/14/2018

CLINICAL DATA: Post right shoulder arthroplasty

EXAM:
PORTABLE RIGHT SHOULDER

[shoulder ap]
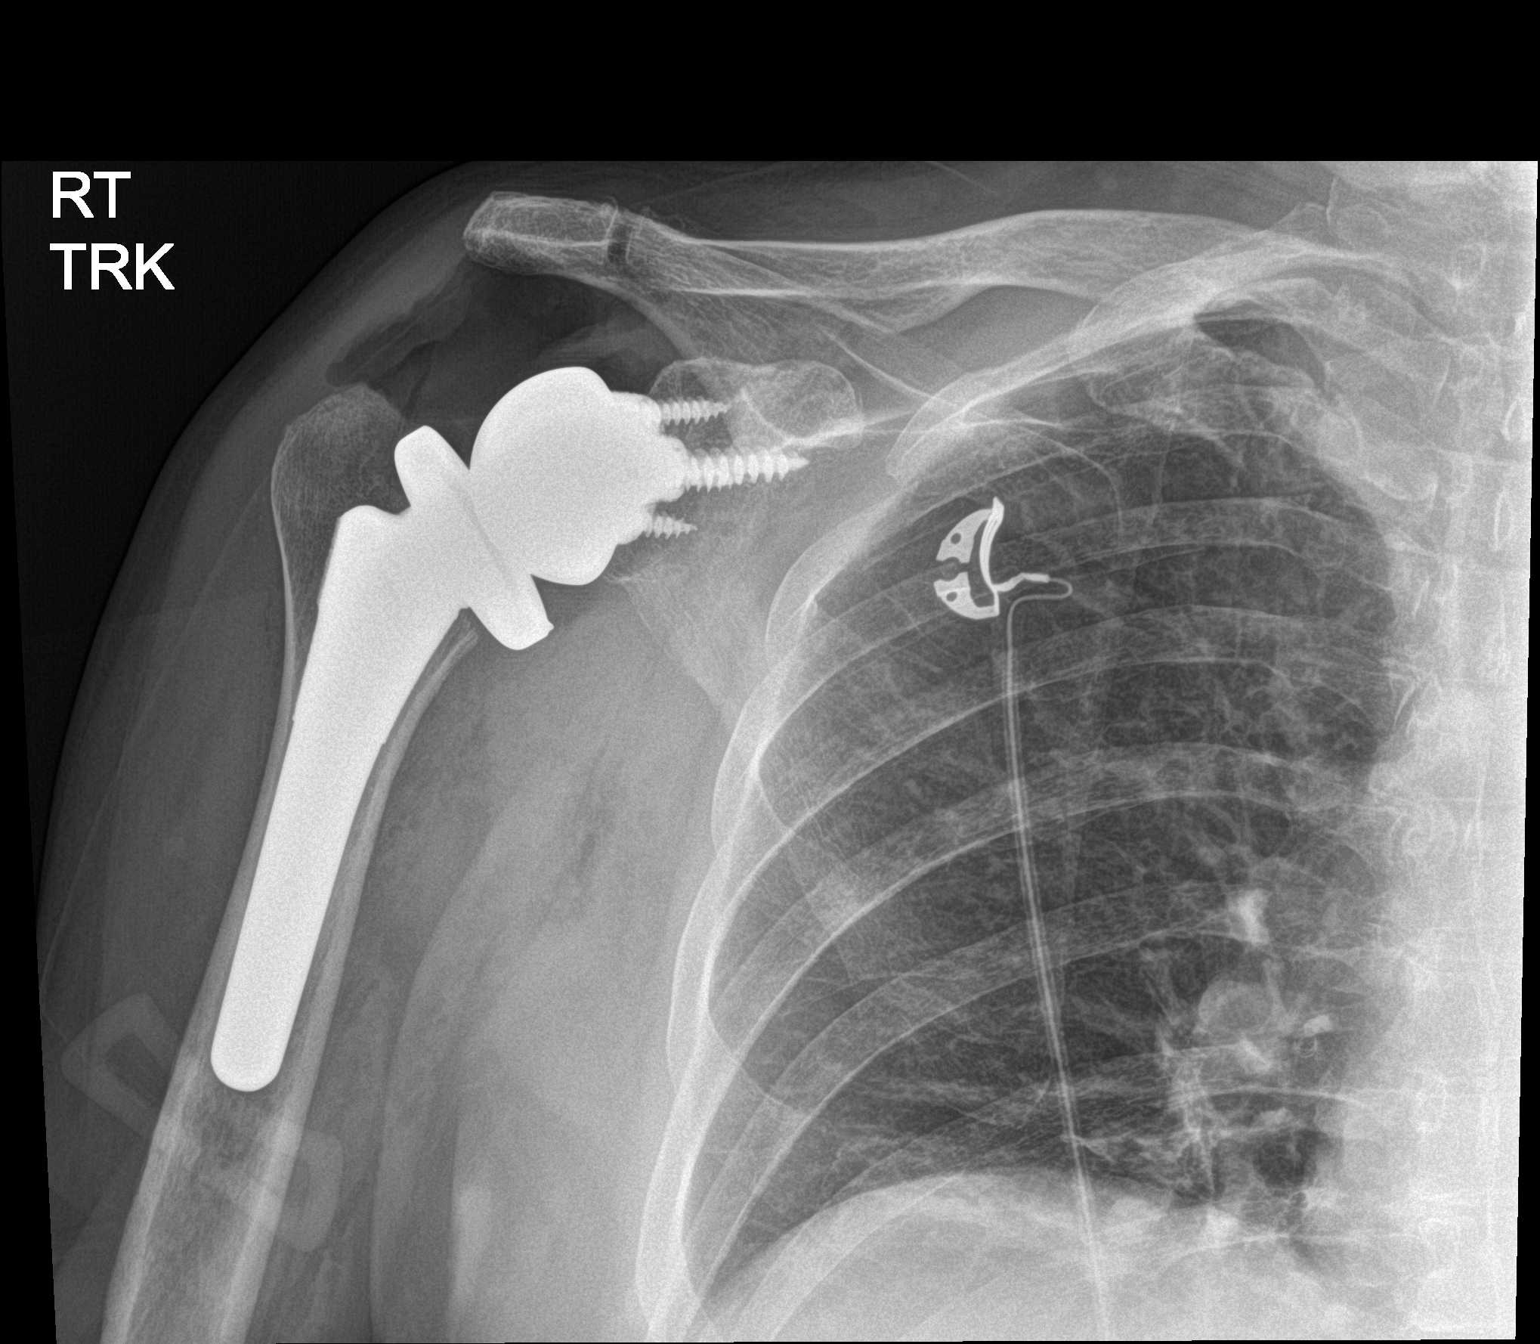

[1 of 1 positions shown; findings below may reference images not displayed]

FINDINGS: Postsurgical changes from reverse right shoulder arthroplasty.
Hardware is in expected alignment. No acute complication or hardware
failure. Expected postsurgical changes of the soft tissues including
intra-articular gas. Mild acromioclavicular arthrosis. Included
portions of the right chest wall and lung are unremarkable.
IMPRESSION: Postsurgical changes from reverse right shoulder arthroplasty
without evidence of acute hardware complication.

## 2021-07-25 DIAGNOSIS — Z85828 Personal history of other malignant neoplasm of skin: Secondary | ICD-10-CM | POA: Diagnosis not present

## 2021-07-25 DIAGNOSIS — C44319 Basal cell carcinoma of skin of other parts of face: Secondary | ICD-10-CM | POA: Diagnosis not present

## 2021-07-31 DIAGNOSIS — Z8619 Personal history of other infectious and parasitic diseases: Secondary | ICD-10-CM | POA: Diagnosis not present

## 2021-07-31 DIAGNOSIS — R197 Diarrhea, unspecified: Secondary | ICD-10-CM | POA: Diagnosis not present

## 2021-08-01 DIAGNOSIS — R197 Diarrhea, unspecified: Secondary | ICD-10-CM | POA: Diagnosis not present

## 2021-08-01 DIAGNOSIS — Z8619 Personal history of other infectious and parasitic diseases: Secondary | ICD-10-CM | POA: Diagnosis not present

## 2021-08-23 DIAGNOSIS — G250 Essential tremor: Secondary | ICD-10-CM | POA: Diagnosis not present

## 2021-08-23 DIAGNOSIS — G930 Cerebral cysts: Secondary | ICD-10-CM | POA: Diagnosis not present

## 2021-08-23 DIAGNOSIS — R7301 Impaired fasting glucose: Secondary | ICD-10-CM | POA: Diagnosis not present

## 2021-08-23 DIAGNOSIS — E782 Mixed hyperlipidemia: Secondary | ICD-10-CM | POA: Diagnosis not present

## 2021-08-23 DIAGNOSIS — Z0000 Encounter for general adult medical examination without abnormal findings: Secondary | ICD-10-CM | POA: Diagnosis not present

## 2021-08-23 DIAGNOSIS — Z9889 Other specified postprocedural states: Secondary | ICD-10-CM | POA: Diagnosis not present

## 2021-08-23 DIAGNOSIS — J301 Allergic rhinitis due to pollen: Secondary | ICD-10-CM | POA: Diagnosis not present

## 2021-08-23 DIAGNOSIS — R197 Diarrhea, unspecified: Secondary | ICD-10-CM | POA: Diagnosis not present

## 2021-09-28 DIAGNOSIS — R197 Diarrhea, unspecified: Secondary | ICD-10-CM | POA: Diagnosis not present

## 2021-11-20 DIAGNOSIS — R197 Diarrhea, unspecified: Secondary | ICD-10-CM | POA: Diagnosis not present

## 2021-12-03 ENCOUNTER — Other Ambulatory Visit: Payer: Self-pay | Admitting: Diagnostic Neuroimaging

## 2022-01-19 DIAGNOSIS — G319 Degenerative disease of nervous system, unspecified: Secondary | ICD-10-CM | POA: Diagnosis not present

## 2022-01-19 DIAGNOSIS — G930 Cerebral cysts: Secondary | ICD-10-CM | POA: Diagnosis not present

## 2022-01-19 DIAGNOSIS — R4189 Other symptoms and signs involving cognitive functions and awareness: Secondary | ICD-10-CM | POA: Diagnosis not present

## 2022-01-22 ENCOUNTER — Other Ambulatory Visit: Payer: Self-pay | Admitting: Family Medicine

## 2022-01-22 DIAGNOSIS — G930 Cerebral cysts: Secondary | ICD-10-CM

## 2022-01-29 DIAGNOSIS — L570 Actinic keratosis: Secondary | ICD-10-CM | POA: Diagnosis not present

## 2022-01-29 DIAGNOSIS — D2262 Melanocytic nevi of left upper limb, including shoulder: Secondary | ICD-10-CM | POA: Diagnosis not present

## 2022-01-29 DIAGNOSIS — D485 Neoplasm of uncertain behavior of skin: Secondary | ICD-10-CM | POA: Diagnosis not present

## 2022-01-29 DIAGNOSIS — C4441 Basal cell carcinoma of skin of scalp and neck: Secondary | ICD-10-CM | POA: Diagnosis not present

## 2022-01-29 DIAGNOSIS — Z85828 Personal history of other malignant neoplasm of skin: Secondary | ICD-10-CM | POA: Diagnosis not present

## 2022-01-29 DIAGNOSIS — D692 Other nonthrombocytopenic purpura: Secondary | ICD-10-CM | POA: Diagnosis not present

## 2022-01-29 DIAGNOSIS — C44212 Basal cell carcinoma of skin of right ear and external auricular canal: Secondary | ICD-10-CM | POA: Diagnosis not present

## 2022-01-29 DIAGNOSIS — L821 Other seborrheic keratosis: Secondary | ICD-10-CM | POA: Diagnosis not present

## 2022-01-29 DIAGNOSIS — D1801 Hemangioma of skin and subcutaneous tissue: Secondary | ICD-10-CM | POA: Diagnosis not present

## 2022-02-13 ENCOUNTER — Other Ambulatory Visit: Payer: PPO

## 2022-02-22 ENCOUNTER — Ambulatory Visit
Admission: RE | Admit: 2022-02-22 | Discharge: 2022-02-22 | Disposition: A | Discharge status: Home / Self Care | Payer: PPO | Source: Ambulatory Visit | Attending: Family Medicine | Admitting: Family Medicine

## 2022-02-22 DIAGNOSIS — G930 Cerebral cysts: Secondary | ICD-10-CM | POA: Diagnosis not present

## 2022-02-22 MED ORDER — GADOPICLENOL 0.5 MMOL/ML IV SOLN
7.0000 mL | Freq: Once | INTRAVENOUS | Status: AC | PRN
  Administered 2022-02-22: 7 mL via INTRAVENOUS

## 2022-03-23 DIAGNOSIS — H524 Presbyopia: Secondary | ICD-10-CM | POA: Diagnosis not present

## 2022-03-23 DIAGNOSIS — H52203 Unspecified astigmatism, bilateral: Secondary | ICD-10-CM | POA: Diagnosis not present

## 2022-03-23 DIAGNOSIS — H25813 Combined forms of age-related cataract, bilateral: Secondary | ICD-10-CM | POA: Diagnosis not present

## 2022-03-23 DIAGNOSIS — H04123 Dry eye syndrome of bilateral lacrimal glands: Secondary | ICD-10-CM | POA: Diagnosis not present

## 2022-03-29 DIAGNOSIS — C44212 Basal cell carcinoma of skin of right ear and external auricular canal: Secondary | ICD-10-CM | POA: Diagnosis not present

## 2022-03-29 DIAGNOSIS — Z85828 Personal history of other malignant neoplasm of skin: Secondary | ICD-10-CM | POA: Diagnosis not present

## 2022-04-16 DIAGNOSIS — M25512 Pain in left shoulder: Secondary | ICD-10-CM | POA: Diagnosis not present

## 2022-04-17 DIAGNOSIS — M25512 Pain in left shoulder: Secondary | ICD-10-CM | POA: Diagnosis not present

## 2022-04-17 DIAGNOSIS — M75102 Unspecified rotator cuff tear or rupture of left shoulder, not specified as traumatic: Secondary | ICD-10-CM | POA: Diagnosis not present

## 2022-04-25 DIAGNOSIS — M75102 Unspecified rotator cuff tear or rupture of left shoulder, not specified as traumatic: Secondary | ICD-10-CM | POA: Diagnosis not present

## 2022-04-30 DIAGNOSIS — Z4802 Encounter for removal of sutures: Secondary | ICD-10-CM | POA: Diagnosis not present

## 2022-05-02 ENCOUNTER — Other Ambulatory Visit: Payer: Self-pay | Admitting: Diagnostic Neuroimaging

## 2022-06-06 DIAGNOSIS — M75102 Unspecified rotator cuff tear or rupture of left shoulder, not specified as traumatic: Secondary | ICD-10-CM | POA: Diagnosis not present

## 2022-06-13 ENCOUNTER — Other Ambulatory Visit: Payer: Self-pay | Admitting: Diagnostic Neuroimaging

## 2022-06-14 NOTE — Telephone Encounter (Signed)
Called pt. Informed him that a yearly follow-up is need to continue getting his medication. I scheduled pt for an office visit with Dr. Leta Baptist on 5/6 @ 1:15 pn.

## 2022-06-22 DIAGNOSIS — M25512 Pain in left shoulder: Secondary | ICD-10-CM | POA: Diagnosis not present

## 2022-06-26 DIAGNOSIS — M25512 Pain in left shoulder: Secondary | ICD-10-CM | POA: Diagnosis not present

## 2022-06-28 DIAGNOSIS — M25512 Pain in left shoulder: Secondary | ICD-10-CM | POA: Diagnosis not present

## 2022-07-03 DIAGNOSIS — M25512 Pain in left shoulder: Secondary | ICD-10-CM | POA: Diagnosis not present

## 2022-07-05 DIAGNOSIS — M25512 Pain in left shoulder: Secondary | ICD-10-CM | POA: Diagnosis not present

## 2022-07-10 ENCOUNTER — Other Ambulatory Visit: Payer: Self-pay | Admitting: Diagnostic Neuroimaging

## 2022-07-10 DIAGNOSIS — M25512 Pain in left shoulder: Secondary | ICD-10-CM | POA: Diagnosis not present

## 2022-07-17 DIAGNOSIS — M25512 Pain in left shoulder: Secondary | ICD-10-CM | POA: Diagnosis not present

## 2022-07-19 DIAGNOSIS — M25512 Pain in left shoulder: Secondary | ICD-10-CM | POA: Diagnosis not present

## 2022-07-19 DIAGNOSIS — M75102 Unspecified rotator cuff tear or rupture of left shoulder, not specified as traumatic: Secondary | ICD-10-CM | POA: Diagnosis not present

## 2022-07-26 DIAGNOSIS — M75122 Complete rotator cuff tear or rupture of left shoulder, not specified as traumatic: Secondary | ICD-10-CM | POA: Diagnosis not present

## 2022-07-26 DIAGNOSIS — M19012 Primary osteoarthritis, left shoulder: Secondary | ICD-10-CM | POA: Diagnosis not present

## 2022-07-26 DIAGNOSIS — M67922 Unspecified disorder of synovium and tendon, left upper arm: Secondary | ICD-10-CM | POA: Diagnosis not present

## 2022-07-26 DIAGNOSIS — S43492A Other sprain of left shoulder joint, initial encounter: Secondary | ICD-10-CM | POA: Diagnosis not present

## 2022-07-26 DIAGNOSIS — S43432A Superior glenoid labrum lesion of left shoulder, initial encounter: Secondary | ICD-10-CM | POA: Diagnosis not present

## 2022-07-26 DIAGNOSIS — M25412 Effusion, left shoulder: Secondary | ICD-10-CM | POA: Diagnosis not present

## 2022-07-30 ENCOUNTER — Ambulatory Visit (INDEPENDENT_AMBULATORY_CARE_PROVIDER_SITE_OTHER): Payer: Medicare Other | Admitting: Diagnostic Neuroimaging

## 2022-07-30 ENCOUNTER — Encounter: Payer: Self-pay | Admitting: Diagnostic Neuroimaging

## 2022-07-30 VITALS — BP 152/78 | HR 76 | Ht 64.0 in | Wt 140.4 lb

## 2022-07-30 DIAGNOSIS — G250 Essential tremor: Secondary | ICD-10-CM

## 2022-07-30 NOTE — Progress Notes (Signed)
[facility]  PATIENT: [name] [name] DOB: [birth year]  REFERRING CLINICIAN: Lupita Raider, MD HISTORY FROM: patient  REASON FOR VISIT: follow up   HISTORICAL  CHIEF COMPLAINT:  Chief Complaint  Patient presents with   Follow-up    Patient in room #7 and alone. Patient states everything is still the same but most of the time he's not notice any changes.    HISTORY OF PRESENT ILLNESS:   UPDATE (07/30/22, VRP): Since last visit, doing well. Symptoms are stable. Severity is mild. No alleviating or aggravating factors. Tolerating propranolol. Tremor not bothering patient that much. Propranolol not helping that much.     UPDATE (11/22/20, VRP): Since last visit, doing well. Tried propranolol and no major changes. Tremor overall very mild. No changes in ADLs.   PRIOR HPI (03/22/20): 82 year old male here for evaluation of tremor.  Symptoms started more than 20 years ago, gradual onset progressive head tremor.  No specific triggering aggravating factors.  No family history of tremor.  Symptoms noticed by other people and by himself.  He has not noticed any tremor in his arms or voice.  No gait or balance difficulties.  No neck issues.  He has had a left lateral intraventricular cyst that has been monitored over time without any significant change or symptoms.     REVIEW OF SYSTEMS: Full 14 system review of systems performed and negative with exception of: As per HPI.  ALLERGIES: Allergies  Allergen Reactions   Bee Venom Hives, Swelling and Rash    Yellow jackets     HOME MEDICATIONS: Outpatient Medications Prior to Visit  Medication Sig Dispense Refill   cholecalciferol (VITAMIN D) 1000 UNITS tablet Take 1,000 Units by mouth daily.     EPINEPHrine 0.3 mg/0.3 mL IJ SOAJ injection Inject 0.3 mg into the muscle as needed for anaphylaxis.     Garlic 500 MG CAPS Take 500 mg by mouth daily.     GLUCOSAMINE-CHONDROITIN PO Take 1,200 mg by mouth daily.     ibuprofen  (ADVIL,MOTRIN) 200 MG tablet Take 400 mg by mouth daily as needed for headache or moderate pain.      loratadine (CLARITIN) 10 MG tablet Take 10 mg by mouth daily as needed.     Multiple Vitamins-Minerals (CENTRUM SILVER PO) Take 1 tablet by mouth daily.     Multiple Vitamins-Minerals (OCUVITE ADULT 50+) CAPS Take 1 capsule by mouth daily.     Polyethyl Glycol-Propyl Glycol (SYSTANE ULTRA OP) Apply to eye as needed.     simvastatin (ZOCOR) 40 MG tablet Take 40 mg by mouth every evening.      propranolol (INDERAL) 20 MG tablet TAKE 1 TABLET(20 MG) BY MOUTH TWICE DAILY 60 tablet 0   acetaminophen (TYLENOL) 500 MG tablet Take 1,000 mg by mouth every 6 (six) hours as needed for moderate pain.      No facility-administered medications prior to visit.    PAST MEDICAL HISTORY: Past Medical History:  Diagnosis Date   AAA (abdominal aortic aneurysm) (HCC)    Arthritis    "fingers"  (02/09/2016)   Basal cell carcinoma of cheek 2012   Chronic low back pain    pt. denies   DDD (degenerative disc disease), lumbar    Headache    History of kidney stones    Hyperlipidemia    Tremor    Trigger finger    left hand    PAST SURGICAL HISTORY: Past Surgical History:  Procedure Laterality Date   ABDOMINAL AORTIC ANEURYSM  REPAIR     ABDOMINAL AORTIC ENDOVASCULAR STENT GRAFT  02/09/2016   ABDOMINAL AORTIC ENDOVASCULAR STENT GRAFT N/A 02/09/2016   Procedure: ABDOMINAL AORTIC ENDOVASCULAR STENT GRAFT;  Surgeon: Chuck Hint, MD;  Location: [facility] OR;  Service: Vascular;  Laterality: N/A;   COLONOSCOPY     MENISCUS REPAIR Right 1960s X 2   MOHS SURGERY Bilateral 2020,2017   "cheeks"   REVERSE SHOULDER ARTHROPLASTY Right 01/20/2019   Procedure: REVERSE SHOULDER ARTHROPLASTY;  Surgeon: Yolonda Kida, MD;  Location: [facility] OR;  Service: Orthopedics;  Laterality: Right;  2.5 hrs   TOOTH EXTRACTION  10/2012   "put me out to pull 1 tooth"    FAMILY HISTORY: Family History  Problem Relation  Age of Onset   Cancer Mother 19       Ovarian   Heart disease Father 48       Endocarditis   Other Brother        plane crash    SOCIAL HISTORY: Social History   Socioeconomic History   Marital status: Widowed    Spouse name: Not on file   Number of children: 2   Years of education: Not on file   Highest education level: Bachelor's degree (e.g., BA, AB, BS)  Occupational History    Comment: retired  Tobacco Use   Smoking status: Former    Packs/day: 2.50    Years: 33.00    Additional pack years: 0.00    Total pack years: 82.50    Types: Cigarettes    Quit date: 03/26/1998    Years since quitting: 24.3   Smokeless tobacco: Never  Vaping Use   Vaping Use: Never used  Substance and Sexual Activity   Alcohol use: Yes    Alcohol/week: 4.0 standard drinks of alcohol    Types: 2 Glasses of wine, 2 Shots of liquor per week   Drug use: No   Sexual activity: Not Currently  Other Topics Concern   Not on file  Social History Narrative   Lives alone   Caffeine- 2 c coffee   Social Determinants of Health   Financial Resource Strain: Not on file  Food Insecurity: Not on file  Transportation Needs: Not on file  Physical Activity: Not on file  Stress: Not on file  Social Connections: Not on file  Intimate Partner Violence: Not on file     PHYSICAL EXAM  GENERAL EXAM/CONSTITUTIONAL: Vitals:  Vitals:   07/30/22 1338  BP: (!) 152/78  Pulse: 76  Weight: 140 lb 6.4 oz (63.7 kg)  Height: 5\' 4"  (1.626 m)   Body mass index is 24.1 kg/m. Wt Readings from Last 3 Encounters:  07/30/22 140 lb 6.4 oz (63.7 kg)  05/25/21 151 lb (68.5 kg)  11/22/20 154 lb 9.6 oz (70.1 kg)   Patient is in no distress; well developed, nourished and groomed; neck is supple  CARDIOVASCULAR: Examination of carotid arteries is normal; no carotid bruits Regular rate and rhythm, no murmurs Examination of peripheral vascular system by observation and palpation is normal  EYES: Ophthalmoscopic  exam of optic discs and posterior segments is normal; no papilledema or hemorrhages No results found.  MUSCULOSKELETAL: Gait, strength, tone, movements noted in Neurologic exam below  NEUROLOGIC: MENTAL STATUS:      No data to display         awake, alert, oriented to person, place and time recent and remote memory intact normal attention and concentration language fluent, comprehension intact, naming intact fund of knowledge appropriate  CRANIAL NERVE:  2nd - no papilledema on fundoscopic exam 2nd, 3rd, 4th, 6th - pupils equal and reactive to light, visual fields full to confrontation, extraocular muscles intact, no nystagmus 5th - facial sensation symmetric 7th - facial strength symmetric 8th - hearing intact 9th - palate elevates symmetrically, uvula midline 11th - shoulder shrug symmetric 12th - tongue protrusion midline MINIMAL HEAD TREMOR  MOTOR:  normal bulk and tone, full strength in the BUE, BLE; LIMITED IN DELTOIDS DUE TO LEFT SHOULDER PAIN; RIGHT SHOULDER SURGERY POST-OP  SENSORY:  normal and symmetric to light touch, temperature, vibration  COORDINATION:  finger-nose-finger, fine finger movements normal  REFLEXES:  deep tendon reflexes TRACE and symmetric  GAIT/STATION:  narrow based gait     DIAGNOSTIC DATA (LABS, IMAGING, TESTING) - I reviewed patient records, labs, notes, testing and imaging myself where available.  Lab Results  Component Value Date   WBC 8.1 01/20/2019   HGB 15.2 01/20/2019   HCT 45.5 01/20/2019   MCV 94.8 01/20/2019   PLT 240 01/20/2019      Component Value Date/Time   NA 137 01/20/2019 1038   K 3.7 01/20/2019 1038   CL 104 01/20/2019 1038   CO2 21 (L) 01/20/2019 1038   GLUCOSE 249 (H) 01/20/2019 1038   BUN 14 01/20/2019 1038   CREATININE 1.35 (H) 01/20/2019 1038   CREATININE 0.93 11/22/2014 0736   CALCIUM 9.3 01/20/2019 1038   PROT 7.1 02/01/2016 1122   ALBUMIN 4.1 02/01/2016 1122   AST 22 02/01/2016 1122    ALT 16 (L) 02/01/2016 1122   ALKPHOS 38 02/01/2016 1122   BILITOT 1.2 02/01/2016 1122   GFRNONAA 50 (L) 01/20/2019 1038   GFRAA 58 (L) 01/20/2019 1038   No results found for: "CHOL", "HDL", "LDLCALC", "LDLDIRECT", "TRIG", "CHOLHDL" No results found for: "HGBA1C" No results found for: "VITAMINB12" No results found for: "TSH"   08/21/19 MRI brain [I reviewed images myself and agree with interpretation. -VRP]  1. 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 No abnormal contrast enhancement. There is thinning of the overlying brain parenchyma. 2. Generalized atrophy without lobar predilection.    ASSESSMENT AND PLAN  82 y.o. year old male here with mild postural head tremor, slightly noticed in the hands, since approximately year 2000.  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   Dx:  1. Essential tremor      PLAN:  ESSENTIAL TREMOR - on low dose propranolol; not much symptoms or benefit; may come off propranolol and then follow up with PCP for BP control  Return for return to PCP, pending if symptoms worsen or fail to improve.    Suanne Marker, MD 07/30/2022, 2:05 PM Certified in Neurology, Neurophysiology and Neuroimaging  [facility] [address] [place], [place] [address] [phone]

## 2022-07-30 NOTE — Patient Instructions (Signed)
ESSENTIAL TREMOR - on low dose propranolol; not much symptoms or benefit; may come off propranolol and then follow up with PCP for BP control

## 2022-08-02 DIAGNOSIS — L570 Actinic keratosis: Secondary | ICD-10-CM | POA: Diagnosis not present

## 2022-08-02 DIAGNOSIS — L821 Other seborrheic keratosis: Secondary | ICD-10-CM | POA: Diagnosis not present

## 2022-08-02 DIAGNOSIS — Z85828 Personal history of other malignant neoplasm of skin: Secondary | ICD-10-CM | POA: Diagnosis not present

## 2022-08-02 DIAGNOSIS — D2271 Melanocytic nevi of right lower limb, including hip: Secondary | ICD-10-CM | POA: Diagnosis not present

## 2022-08-02 DIAGNOSIS — D2272 Melanocytic nevi of left lower limb, including hip: Secondary | ICD-10-CM | POA: Diagnosis not present

## 2022-08-02 DIAGNOSIS — D225 Melanocytic nevi of trunk: Secondary | ICD-10-CM | POA: Diagnosis not present

## 2022-08-30 DIAGNOSIS — J301 Allergic rhinitis due to pollen: Secondary | ICD-10-CM | POA: Diagnosis not present

## 2022-08-30 DIAGNOSIS — I714 Abdominal aortic aneurysm, without rupture, unspecified: Secondary | ICD-10-CM | POA: Diagnosis not present

## 2022-08-30 DIAGNOSIS — G250 Essential tremor: Secondary | ICD-10-CM | POA: Diagnosis not present

## 2022-08-30 DIAGNOSIS — R7301 Impaired fasting glucose: Secondary | ICD-10-CM | POA: Diagnosis not present

## 2022-08-30 DIAGNOSIS — R197 Diarrhea, unspecified: Secondary | ICD-10-CM | POA: Diagnosis not present

## 2022-08-30 DIAGNOSIS — E782 Mixed hyperlipidemia: Secondary | ICD-10-CM | POA: Diagnosis not present

## 2022-08-30 DIAGNOSIS — Z0000 Encounter for general adult medical examination without abnormal findings: Secondary | ICD-10-CM | POA: Diagnosis not present

## 2022-08-30 DIAGNOSIS — M1990 Unspecified osteoarthritis, unspecified site: Secondary | ICD-10-CM | POA: Diagnosis not present

## 2022-08-30 DIAGNOSIS — G930 Cerebral cysts: Secondary | ICD-10-CM | POA: Diagnosis not present

## 2022-09-06 DIAGNOSIS — M79671 Pain in right foot: Secondary | ICD-10-CM | POA: Diagnosis not present

## 2022-09-11 DIAGNOSIS — M75122 Complete rotator cuff tear or rupture of left shoulder, not specified as traumatic: Secondary | ICD-10-CM | POA: Diagnosis not present

## 2022-09-18 DIAGNOSIS — M75122 Complete rotator cuff tear or rupture of left shoulder, not specified as traumatic: Secondary | ICD-10-CM | POA: Diagnosis not present

## 2022-11-13 DIAGNOSIS — M75122 Complete rotator cuff tear or rupture of left shoulder, not specified as traumatic: Secondary | ICD-10-CM | POA: Diagnosis not present

## 2022-11-19 ENCOUNTER — Other Ambulatory Visit: Payer: Self-pay | Admitting: Orthopedic Surgery

## 2022-11-19 DIAGNOSIS — M75122 Complete rotator cuff tear or rupture of left shoulder, not specified as traumatic: Secondary | ICD-10-CM

## 2022-11-19 DIAGNOSIS — D692 Other nonthrombocytopenic purpura: Secondary | ICD-10-CM | POA: Diagnosis not present

## 2022-11-19 DIAGNOSIS — Z85828 Personal history of other malignant neoplasm of skin: Secondary | ICD-10-CM | POA: Diagnosis not present

## 2022-11-19 DIAGNOSIS — L570 Actinic keratosis: Secondary | ICD-10-CM | POA: Diagnosis not present

## 2022-11-19 DIAGNOSIS — L720 Epidermal cyst: Secondary | ICD-10-CM | POA: Diagnosis not present

## 2022-11-20 DIAGNOSIS — Z111 Encounter for screening for respiratory tuberculosis: Secondary | ICD-10-CM | POA: Diagnosis not present

## 2022-11-27 ENCOUNTER — Ambulatory Visit
Admission: RE | Admit: 2022-11-27 | Discharge: 2022-11-27 | Disposition: A | Discharge status: Home / Self Care | Payer: Medicare Other | Source: Ambulatory Visit | Attending: Orthopedic Surgery | Admitting: Orthopedic Surgery

## 2022-11-27 DIAGNOSIS — G8929 Other chronic pain: Secondary | ICD-10-CM | POA: Diagnosis not present

## 2022-11-27 DIAGNOSIS — M25512 Pain in left shoulder: Secondary | ICD-10-CM | POA: Diagnosis not present

## 2022-11-27 DIAGNOSIS — M75122 Complete rotator cuff tear or rupture of left shoulder, not specified as traumatic: Secondary | ICD-10-CM

## 2022-11-27 DIAGNOSIS — M19012 Primary osteoarthritis, left shoulder: Secondary | ICD-10-CM | POA: Diagnosis not present

## 2022-12-04 NOTE — Progress Notes (Signed)
Fax received from [facility] on 11/13/22 for medical clearance/medication hold for left should reverse total shoulder arthroplasty to be signed by C. Edilia Bo, MD.  Provider signed on 11/16/22, form faxed back to sender on 11/29/22, verified successful, sent to scan center.

## 2022-12-25 DIAGNOSIS — M75122 Complete rotator cuff tear or rupture of left shoulder, not specified as traumatic: Secondary | ICD-10-CM | POA: Diagnosis not present

## 2023-01-05 DIAGNOSIS — Z23 Encounter for immunization: Secondary | ICD-10-CM | POA: Diagnosis not present

## 2023-01-23 DIAGNOSIS — Z23 Encounter for immunization: Secondary | ICD-10-CM | POA: Diagnosis not present

## 2023-03-06 DIAGNOSIS — L570 Actinic keratosis: Secondary | ICD-10-CM | POA: Diagnosis not present

## 2023-03-06 DIAGNOSIS — L814 Other melanin hyperpigmentation: Secondary | ICD-10-CM | POA: Diagnosis not present

## 2023-03-06 DIAGNOSIS — Z85828 Personal history of other malignant neoplasm of skin: Secondary | ICD-10-CM | POA: Diagnosis not present

## 2023-03-06 DIAGNOSIS — L821 Other seborrheic keratosis: Secondary | ICD-10-CM | POA: Diagnosis not present

## 2023-03-06 DIAGNOSIS — L308 Other specified dermatitis: Secondary | ICD-10-CM | POA: Diagnosis not present

## 2023-03-13 DIAGNOSIS — H2513 Age-related nuclear cataract, bilateral: Secondary | ICD-10-CM | POA: Diagnosis not present

## 2023-03-25 NOTE — Progress Notes (Addendum)
 Anesthesia Review:  PCP: Joen Gentry  Cardiologist :  none Neuro- Pneumalli LOV 07/30/22  Vasc- DR christopher dickson clearance dated 11/16/22  in Media Tab dated 02/06/23  Chest x-ray : EKG :03/29/23  Echo : Stress test: Cardiac Cath :  Activity level: can do a flight of stairs without difficutly  Sleep Study/ CPAP : none  Fasting Blood Sugar :      / Checks Blood Sugar -- times a day:   Blood Thinner/ Instructions /Last Dose: ASA / Instructions/ Last Dose :     Hx of fine tremors per pt and some involuntary head movemnt.  Followed by DR Pneumalli.     Blood pressure 183/77 at preop on 03/29/23.  PT denies any chest pain, shortness of breath, dizziness or blurred vision or headache.

## 2023-03-25 NOTE — Patient Instructions (Signed)
 SURGICAL WAITING ROOM VISITATION  Patients having surgery or a procedure may have no more than 2 support people in the waiting area - these visitors may rotate.    Children under the age of 13 must have an adult with them who is not the patient.  Due to an increase in RSV and influenza rates and associated hospitalizations, children ages 47 and under may not visit patients in [facility] hospitals.  If the patient needs to stay at the hospital during part of their recovery, the visitor guidelines for inpatient rooms apply. Pre-op nurse will coordinate an appropriate time for 1 support person to accompany patient in pre-op.  This support person may not rotate.    Please refer to the [facility] website for the visitor guidelines for Inpatients (after your surgery is over and you are in a regular room).       Your procedure is scheduled on:  04/05/2023    Report to [facility] Main Entrance    Report to admitting at   [phone]   Call this number if you have problems the morning of surgery [phone]   Do not eat food :After Midnight.   After Midnight you may have the following liquids until __0415____ AM DAY OF SURGERY   Water Non-Citrus Juices (without pulp, NO RED-Apple, White grape, White cranberry) Black Coffee (NO MILK/CREAM OR CREAMERS, sugar ok)  Clear Tea (NO MILK/CREAM OR CREAMERS, sugar ok) regular and decaf                             Plain Jell-O (NO RED)                                           Fruit ices (not with fruit pulp, NO RED)                                     Popsicles (NO RED)                                                               Sports drinks like Gatorade (NO RED)                     The day of surgery:  Drink ONE (1) Pre-Surgery Clear Ensure or G2 at  04151 AM  ( have completed by )  the morning of surgery. Drink in one sitting. Do not sip.  This drink was given to you during your hospital  pre-op appointment visit. Nothing else to  drink after completing the  Pre-Surgery Clear Ensure or G2.          If you have questions, please contact your surgeon's office.        Oral Hygiene is also important to reduce your risk of infection.                                    Remember - BRUSH YOUR TEETH THE MORNING  OF SURGERY WITH YOUR REGULAR TOOTHPASTE  DENTURES WILL BE REMOVED PRIOR TO SURGERY PLEASE DO NOT APPLY Poly grip OR ADHESIVES!!!   Do NOT smoke after Midnight   Stop all vitamins and herbal supplements 7 days before surgery.   Take these medicines the morning of surgery with A SIP OF WATER:  none   DO NOT TAKE ANY ORAL DIABETIC MEDICATIONS DAY OF YOUR SURGERY  Bring CPAP mask and tubing day of surgery.                              You may not have any metal on your body including hair pins, jewelry, and body piercing             Do not wear make-up, lotions, powders, perfumes/cologne, or deodorant  Do not wear nail polish including gel and S&S, artificial/acrylic nails, or any other type of covering on natural nails including finger and toenails. If you have artificial nails, gel coating, etc. that needs to be removed by a nail salon please have this removed prior to surgery or surgery may need to be canceled/ delayed if the surgeon/ anesthesia feels like they are unable to be safely monitored.   Do not shave  48 hours prior to surgery.               Men may shave face and neck.   Do not bring valuables to the hospital. [facility] IS NOT             RESPONSIBLE   FOR VALUABLES.   Contacts, glasses, dentures or bridgework may not be worn into surgery.   Bring small overnight bag day of surgery.   DO NOT BRING YOUR HOME MEDICATIONS TO THE HOSPITAL. PHARMACY WILL DISPENSE MEDICATIONS LISTED ON YOUR MEDICATION LIST TO YOU DURING YOUR ADMISSION IN THE HOSPITAL!    Patients discharged on the day of surgery will not be allowed to drive home.  Someone NEEDS to stay with you for the first 24 hours after  anesthesia.   Special Instructions: Bring a copy of your healthcare power of attorney and living will documents the day of surgery if you haven't scanned them before.              Please read over the following fact sheets you were given: IF YOU HAVE QUESTIONS ABOUT YOUR PRE-OP INSTRUCTIONS PLEASE CALL [phone]   If you received a COVID test during your pre-op visit  it is requested that you wear a mask when out in public, stay away from anyone that may not be feeling well and notify your surgeon if you develop symptoms. If you test positive for Covid or have been in contact with anyone that has tested positive in the last 10 days please notify you surgeon.      Pre-operative 5 CHG Bath Instructions   You can play a key role in reducing the risk of infection after surgery. Your skin needs to be as free of germs as possible. You can reduce the number of germs on your skin by washing with CHG (chlorhexidine  gluconate) soap before surgery. CHG is an antiseptic soap that kills germs and continues to kill germs even after washing.   DO NOT use if you have an allergy to chlorhexidine /CHG or antibacterial soaps. If your skin becomes reddened or irritated, stop using the CHG and notify one of our RNs at [phone].   Please shower with the  CHG soap starting 4 days before surgery using the following schedule:     Please keep in mind the following:  DO NOT shave, including legs and underarms, starting the day of your first shower.   You may shave your face at any point before/day of surgery.  Place clean sheets on your bed the day you start using CHG soap. Use a clean washcloth (not used since being washed) for each shower. DO NOT sleep with pets once you start using the CHG.   CHG Shower Instructions:  If you choose to wash your hair and private area, wash first with your normal shampoo/soap.  After you use shampoo/soap, rinse your hair and body thoroughly to remove shampoo/soap residue.   Turn the water OFF and apply about 3 tablespoons (45 ml) of CHG soap to a CLEAN washcloth.  Apply CHG soap ONLY FROM YOUR NECK DOWN TO YOUR TOES (washing for 3-5 minutes)  DO NOT use CHG soap on face, private areas, open wounds, or sores.  Pay special attention to the area where your surgery is being performed.  If you are having back surgery, having someone wash your back for you may be helpful. Wait 2 minutes after CHG soap is applied, then you may rinse off the CHG soap.  Pat dry with a clean towel  Put on clean clothes/pajamas   If you choose to wear lotion, please use ONLY the CHG-compatible lotions on the back of this paper.     Additional instructions for the day of surgery: DO NOT APPLY any lotions, deodorants, cologne, or perfumes.   Put on clean/comfortable clothes.  Brush your teeth.  Ask your nurse before applying any prescription medications to the skin.      CHG Compatible Lotions   Aveeno Moisturizing lotion  Cetaphil Moisturizing Cream  Cetaphil Moisturizing Lotion  Clairol Herbal Essence Moisturizing Lotion, Dry Skin  Clairol Herbal Essence Moisturizing Lotion, Extra Dry Skin  Clairol Herbal Essence Moisturizing Lotion, Normal Skin  Curel Age Defying Therapeutic Moisturizing Lotion with Alpha Hydroxy  Curel Extreme Care Body Lotion  Curel Soothing Hands Moisturizing Hand Lotion  Curel Therapeutic Moisturizing Cream, Fragrance-Free  Curel Therapeutic Moisturizing Lotion, Fragrance-Free  Curel Therapeutic Moisturizing Lotion, Original Formula  Eucerin Daily Replenishing Lotion  Eucerin Dry Skin Therapy Plus Alpha Hydroxy Crme  Eucerin Dry Skin Therapy Plus Alpha Hydroxy Lotion  Eucerin Original Crme  Eucerin Original Lotion  Eucerin Plus Crme Eucerin Plus Lotion  Eucerin TriLipid Replenishing Lotion  Keri Anti-Bacterial Hand Lotion  Keri Deep Conditioning Original Lotion Dry Skin Formula Softly Scented  Keri Deep Conditioning Original Lotion, Fragrance  Free Sensitive Skin Formula  Keri Lotion Fast Absorbing Fragrance Free Sensitive Skin Formula  Keri Lotion Fast Absorbing Softly Scented Dry Skin Formula  Keri Original Lotion  Keri Skin Renewal Lotion Keri Silky Smooth Lotion  Keri Silky Smooth Sensitive Skin Lotion  Nivea Body Creamy Conditioning Oil  Nivea Body Extra Enriched Lotion  Nivea Body Original Lotion  Nivea Body Sheer Moisturizing Lotion Nivea Crme  Nivea Skin Firming Lotion  NutraDerm 30 Skin Lotion  NutraDerm Skin Lotion  NutraDerm Therapeutic Skin Cream  NutraDerm Therapeutic Skin Lotion  ProShield Protective Hand Cream  Provon moisturizing lotion   - Preparing for Total Shoulder Arthroplasty    Before surgery, you can play an important role. Because skin is not sterile, your skin needs to be as free of germs as possible. You can reduce the number of germs on your skin by using the following  products. Benzoyl Peroxide Gel Reduces the number of germs present on the skin Applied twice a day to shoulder area starting two days before surgery    ==================================================================  Please follow these instructions carefully:  BENZOYL PEROXIDE 5% GEL  Please do not use if you have an allergy to benzoyl peroxide.   If your skin becomes reddened/irritated stop using the benzoyl peroxide.  Starting two days before surgery, apply as follows: Apply benzoyl peroxide in the morning and at night. Apply after taking a shower. If you are not taking a shower clean entire shoulder front, back, and side along with the armpit with a clean wet washcloth.  Place a quarter-sized dollop on your shoulder and rub in thoroughly, making sure to cover the front, back, and side of your shoulder, along with the armpit.   2 days before ____ AM   ____ PM              1 day before ____ AM   ____ PM                         Do this twice a day for two days.  (Last application is the night before surgery,  AFTER using the CHG soap as described below).  Do NOT apply benzoyl peroxide gel on the day of surgery.

## 2023-03-28 DIAGNOSIS — M75122 Complete rotator cuff tear or rupture of left shoulder, not specified as traumatic: Secondary | ICD-10-CM | POA: Diagnosis not present

## 2023-03-29 ENCOUNTER — Other Ambulatory Visit: Payer: Self-pay

## 2023-03-29 ENCOUNTER — Encounter (HOSPITAL_COMMUNITY): Payer: Self-pay

## 2023-03-29 ENCOUNTER — Encounter (HOSPITAL_COMMUNITY)
Admission: RE | Admit: 2023-03-29 | Discharge: 2023-03-29 | Disposition: A | Discharge status: Home / Self Care | Payer: Medicare Other | Source: Ambulatory Visit | Attending: Orthopedic Surgery | Admitting: Orthopedic Surgery

## 2023-03-29 VITALS — BP 182/95 | HR 53 | Temp 97.3°F | Resp 16 | Ht 65.0 in | Wt 137.0 lb

## 2023-03-29 DIAGNOSIS — Z01818 Encounter for other preprocedural examination: Secondary | ICD-10-CM | POA: Diagnosis not present

## 2023-03-29 HISTORY — DX: Tremor, unspecified: R25.1

## 2023-03-29 LAB — BASIC METABOLIC PANEL
Anion gap: 8 (ref 5–15)
BUN: 20 mg/dL (ref 8–23)
CO2: 24 mmol/L (ref 22–32)
Calcium: 9.4 mg/dL (ref 8.9–10.3)
Chloride: 106 mmol/L (ref 98–111)
Creatinine, Ser: 0.96 mg/dL (ref 0.61–1.24)
GFR, Estimated: >60 mL/min (ref >60)
Glucose, Bld: 120 mg/dL — ABNORMAL HIGH (ref 70–99)
Potassium: 3.9 mmol/L (ref 3.5–5.1)
Sodium: 138 mmol/L (ref 135–145)

## 2023-03-29 LAB — CBC
HCT: 47.3 % (ref 39.0–52.0)
Hemoglobin: 15.4 g/dL (ref 13.0–17.0)
MCH: 31.8 pg (ref 26.0–34.0)
MCHC: 32.6 g/dL (ref 30.0–36.0)
MCV: 97.5 fL (ref 80.0–100.0)
Platelets: 261 10*3/uL (ref 150–400)
RBC: 4.85 MIL/uL (ref 4.22–5.81)
RDW: 14.1 % (ref 11.5–15.5)
WBC: 8 10*3/uL (ref 4.0–10.5)
nRBC: 0 % (ref 0.0–0.2)

## 2023-03-29 LAB — SURGICAL PCR SCREEN
MRSA, PCR: NEGATIVE
Staphylococcus aureus: NEGATIVE

## 2023-04-02 NOTE — Progress Notes (Signed)
 Anesthesia Chart Review   Case: [id number] Date/Time: 04/05/23 0700   Procedure: REVERSE SHOULDER ARTHROPLASTY (Left: Shoulder) - 120   Anesthesia type: Choice   Pre-op diagnosis: Left shoulder rotator cuff arthropathy   Location: [facility]OM 08 / [facility]   Surgeons: Sharl Selinda Dover, MD       DISCUSSION:83 y.o. former smoker with h/o AAA s/p endovascular aneurysm repair 2017, essential tremor on propranalol, left shoulder OA scheduled for above procedure 04/05/2023 with Dr. Selinda Sharl.   Clearance received from vascular surgeon which states pt is low risk from vascular standpoint.   VS: BP (!) 183/77   Pulse (!) 57   Temp 36.7 C (Oral)   Resp 16   Ht 5' 5 (1.651 m)   Wt 62.1 kg   SpO2 100%   BMI 22.80 kg/m   PROVIDERS: Loreli Kins, MD is PCP    LABS: Labs reviewed: Acceptable for surgery. (all labs ordered are listed, but only abnormal results are displayed)  Labs Reviewed  BASIC METABOLIC PANEL - Abnormal; Notable for the following components:      Result Value   Glucose, Bld 120 (*)    All other components within normal limits  SURGICAL PCR SCREEN  CBC     IMAGES: VAS US  EVAR DUPLEX 05/11/2021 Summary:  Abdominal Aorta: Patent endovascular aneurysm repair with no evidence of  endoleak. The largest aortic diameter has decreased compared to prior  exam. Previous diameter measurement was 5.51 x 5.54 cm obtained on  05/06/19.   EKG:   CV: Echo 01/05/2019 1. Left ventricular ejection fraction, by visual estimation, is 65 to  70%. The left ventricle has normal function. Normal left ventricular size.  There is no left ventricular hypertrophy.   2. Left ventricular diastolic Doppler parameters are consistent with  impaired relaxation pattern of LV diastolic filling.   3. Global right ventricle has normal systolic function.The right  ventricular size is normal. No increase in right ventricular wall  thickness.   4. Left atrial size was normal.   5. Right  atrial size was normal.   6. Mild to moderate mitral annular calcification.   7. The mitral valve is normal in structure. No evidence of mitral valve  regurgitation. No evidence of mitral stenosis.   8. The tricuspid valve is normal in structure. Tricuspid valve  regurgitation is trivial.   9. The aortic valve is trileaflt. Aortic valve regurgitation was not  visualized by color flow Doppler. Severe calcification of the non coronary  cusp with possible fusion of the non coronary and left coronary cusps.  10. The pulmonic valve was normal in structure. Pulmonic valve  regurgitation is mild by color flow Doppler.  11. TR signal is inadequate for assessing pulmonary artery systolic  pressure.  12. The inferior vena cava is normal in size with greater than 50%  respiratory variability, suggesting right atrial pressure of 3 mmHg.  Past Medical History:  Diagnosis Date   AAA (abdominal aortic aneurysm) (HCC)    Arthritis    fingers  (02/09/2016)   Basal cell carcinoma of cheek 2012   Chronic low back pain    pt. denies   DDD (degenerative disc disease), lumbar    History of kidney stones    Hyperlipidemia    Tremor    Tremors of nervous system    fine tremors per pt and some involuntary head movement per pt   Trigger finger    left hand    Past Surgical History:  Procedure Laterality Date   ABDOMINAL AORTIC ANEURYSM REPAIR     ABDOMINAL AORTIC ENDOVASCULAR STENT GRAFT  02/09/2016   ABDOMINAL AORTIC ENDOVASCULAR STENT GRAFT N/A 02/09/2016   Procedure: ABDOMINAL AORTIC ENDOVASCULAR STENT GRAFT;  Surgeon: Lonni GORMAN Blade, MD;  Location: [facility] OR;  Service: Vascular;  Laterality: N/A;   COLONOSCOPY     MENISCUS REPAIR Right 1960s X 2   MOHS SURGERY Bilateral 2020,2017   cheeks   REVERSE SHOULDER ARTHROPLASTY Right 01/20/2019   Procedure: REVERSE SHOULDER ARTHROPLASTY;  Surgeon: Sharl Selinda Dover, MD;  Location: [facility] OR;  Service: Orthopedics;  Laterality: Right;  2.5 hrs    TOOTH EXTRACTION  10/2012   put me out to pull 1 tooth    MEDICATIONS:  Apoaequorin (PREVAGEN EXTRA STRENGTH) 20 MG CAPS   aspirin  EC 81 MG tablet   Biotin 89999 MCG TABS   cholecalciferol  (VITAMIN D ) 1000 UNITS tablet   cholestyramine (QUESTRAN) 4 g packet   Garlic  1200 MG CAPS   GLUCOSAMINE-CHONDROITIN PO   ibuprofen  (ADVIL ,MOTRIN ) 200 MG tablet   loratadine  (CLARITIN ) 10 MG tablet   meloxicam (MOBIC) 7.5 MG tablet   Multiple Vitamins-Minerals (CENTRUM SILVER PO)   Multiple Vitamins-Minerals (OCUVITE ADULT 50+) CAPS   Omega-3 Fatty Acids (FISH OIL) 1200 MG CAPS   Polyethyl Glycol-Propyl Glycol (SYSTANE ULTRA OP)   propranolol  (INDERAL ) 10 MG tablet   sildenafil (VIAGRA) 100 MG tablet   simvastatin  (ZOCOR ) 40 MG tablet   No current facility-administered medications for this encounter.    Harlene Hoots Ward, PA-C [facility] Pre-Surgical Testing [phone]

## 2023-04-03 ENCOUNTER — Encounter: Payer: Self-pay | Admitting: Neurology

## 2023-04-04 NOTE — Anesthesia Preprocedure Evaluation (Addendum)
 Anesthesia Evaluation  Patient identified by MRN, date of birth, ID band Patient awake    Reviewed: Allergy & Precautions, NPO status , Patient's Chart, lab work & pertinent test results  Airway Mallampati: II  TM Distance: >3 FB Neck ROM: Full    Dental no notable dental hx. (+) Teeth Intact   Pulmonary neg pulmonary ROS, former smoker   Pulmonary exam normal breath sounds clear to auscultation       Cardiovascular Normal cardiovascular exam Rhythm:Regular Rate:Normal     Neuro/Psych  negative psych ROS   GI/Hepatic negative GI ROS, Neg liver ROS,,,  Endo/Other  negative endocrine ROS    Renal/GU Lab Results      Component                Value               Date                          K                        3.9                 03/29/2023                    Musculoskeletal  (+) Arthritis , Osteoarthritis,    Abdominal   Peds  Hematology Lab Results      Component                Value               Date                      WBC                      8.0                 03/29/2023                HGB                      15.4                03/29/2023                HCT                      47.3                03/29/2023                MCV                      97.5                03/29/2023                PLT                      261                 03/29/2023              Anesthesia Other Findings   Reproductive/Obstetrics  Anesthesia Physical Anesthesia Plan  ASA: 3  Anesthesia Plan: Regional and General   Post-op Pain Management: Regional block*, Minimal or no pain anticipated, Precedex and Ofirmev  IV (intra-op)*   Induction: Intravenous  PONV Risk Score and Plan: 3 and Treatment may vary due to age or medical condition and Ondansetron   Airway Management Planned: Oral ETT  Additional Equipment: None  Intra-op Plan:   Post-operative Plan:  Extubation in OR  Informed Consent: I have reviewed the patients History and Physical, chart, labs and discussed the procedure including the risks, benefits and alternatives for the proposed anesthesia with the patient or authorized representative who has indicated his/her understanding and acceptance.     Dental advisory given  Plan Discussed with: CRNA and Anesthesiologist  Anesthesia Plan Comments: (GA W L ISB)        Anesthesia Quick Evaluation

## 2023-04-04 NOTE — Progress Notes (Signed)
 Pt called to state he has reacted to acne medication given for scheduled surgical procedure for Friday 04/05/2023. Has applied two applications and will not apply any more prior to arrival tomorrow. Pt noted redness and small rash.

## 2023-04-05 ENCOUNTER — Observation Stay (HOSPITAL_COMMUNITY): Payer: Medicare Other

## 2023-04-05 ENCOUNTER — Ambulatory Visit (HOSPITAL_COMMUNITY): Payer: Medicare Other | Admitting: Physician Assistant

## 2023-04-05 ENCOUNTER — Encounter (HOSPITAL_COMMUNITY): Payer: Self-pay | Admitting: Orthopedic Surgery

## 2023-04-05 ENCOUNTER — Observation Stay (HOSPITAL_COMMUNITY)
Admission: RE | Admit: 2023-04-05 | Discharge: 2023-04-06 | Disposition: A | Discharge status: Home / Self Care | Payer: Medicare Other | Attending: Orthopedic Surgery | Admitting: Orthopedic Surgery

## 2023-04-05 ENCOUNTER — Encounter (HOSPITAL_COMMUNITY)
Admission: RE | Disposition: A | Discharge status: Home / Self Care | Payer: Self-pay | Source: Home / Self Care | Attending: Orthopedic Surgery

## 2023-04-05 ENCOUNTER — Other Ambulatory Visit: Payer: Self-pay

## 2023-04-05 ENCOUNTER — Ambulatory Visit (HOSPITAL_COMMUNITY): Payer: Medicare Other | Admitting: Anesthesiology

## 2023-04-05 DIAGNOSIS — Z96612 Presence of left artificial shoulder joint: Principal | ICD-10-CM

## 2023-04-05 DIAGNOSIS — G8918 Other acute postprocedural pain: Secondary | ICD-10-CM | POA: Diagnosis not present

## 2023-04-05 DIAGNOSIS — Z79899 Other long term (current) drug therapy: Secondary | ICD-10-CM | POA: Insufficient documentation

## 2023-04-05 DIAGNOSIS — Z87891 Personal history of nicotine dependence: Secondary | ICD-10-CM | POA: Diagnosis not present

## 2023-04-05 DIAGNOSIS — Z85828 Personal history of other malignant neoplasm of skin: Secondary | ICD-10-CM | POA: Insufficient documentation

## 2023-04-05 DIAGNOSIS — M12812 Other specific arthropathies, not elsewhere classified, left shoulder: Secondary | ICD-10-CM | POA: Diagnosis not present

## 2023-04-05 DIAGNOSIS — M19012 Primary osteoarthritis, left shoulder: Secondary | ICD-10-CM | POA: Diagnosis not present

## 2023-04-05 DIAGNOSIS — Z471 Aftercare following joint replacement surgery: Secondary | ICD-10-CM | POA: Diagnosis not present

## 2023-04-05 DIAGNOSIS — E785 Hyperlipidemia, unspecified: Secondary | ICD-10-CM

## 2023-04-05 DIAGNOSIS — Z96611 Presence of right artificial shoulder joint: Secondary | ICD-10-CM | POA: Insufficient documentation

## 2023-04-05 DIAGNOSIS — Z01818 Encounter for other preprocedural examination: Secondary | ICD-10-CM

## 2023-04-05 HISTORY — PX: REVERSE SHOULDER ARTHROPLASTY: SHX5054

## 2023-04-05 SURGERY — ARTHROPLASTY, SHOULDER, TOTAL, REVERSE
Anesthesia: Regional | Site: Shoulder | Laterality: Left

## 2023-04-05 MED ORDER — ASPIRIN 81 MG PO TBEC
81.0000 mg | DELAYED_RELEASE_TABLET | Freq: Every day | ORAL | Status: DC
  Administered 2023-04-05 – 2023-04-06 (×2): 81 mg via ORAL
  Filled 2023-04-05 (×2): qty 1

## 2023-04-05 MED ORDER — GLYCOPYRROLATE 0.2 MG/ML IJ SOLN
INTRAMUSCULAR | Status: DC | PRN
  Administered 2023-04-05: .2 mg via INTRAVENOUS

## 2023-04-05 MED ORDER — BUPIVACAINE HCL (PF) 0.5 % IJ SOLN
INTRAMUSCULAR | Status: DC | PRN
  Administered 2023-04-05: 15 mL via PERINEURAL

## 2023-04-05 MED ORDER — OXYCODONE HCL 5 MG PO TABS: 5.0000 mg | ORAL_TABLET | Freq: Four times a day (QID) | ORAL | 0 refills | Status: DC | PRN

## 2023-04-05 MED ORDER — METHOCARBAMOL 500 MG PO TABS: 500.0000 mg | ORAL_TABLET | Freq: Four times a day (QID) | ORAL | Status: DC | PRN

## 2023-04-05 MED ORDER — PROPOFOL 10 MG/ML IV BOLUS
INTRAVENOUS | Status: AC
  Filled 2023-04-05: qty 20

## 2023-04-05 MED ORDER — ORAL CARE MOUTH RINSE: 15.0000 mL | Freq: Once | OROMUCOSAL | Status: DC

## 2023-04-05 MED ORDER — DOCUSATE SODIUM 100 MG PO CAPS
100.0000 mg | ORAL_CAPSULE | Freq: Two times a day (BID) | ORAL | Status: DC
  Administered 2023-04-05 (×2): 100 mg via ORAL
  Filled 2023-04-05 (×3): qty 1

## 2023-04-05 MED ORDER — ONDANSETRON HCL 4 MG/2ML IJ SOLN
4.0000 mg | Freq: Four times a day (QID) | INTRAMUSCULAR | Status: DC | PRN
  Administered 2023-04-05: 4 mg via INTRAVENOUS
  Filled 2023-04-05: qty 2

## 2023-04-05 MED ORDER — TRANEXAMIC ACID-NACL 1000-0.7 MG/100ML-% IV SOLN
1000.0000 mg | INTRAVENOUS | Status: AC
  Administered 2023-04-05: 1000 mg via INTRAVENOUS
  Filled 2023-04-05: qty 100

## 2023-04-05 MED ORDER — ONDANSETRON HCL 4 MG PO TABS: 4.0000 mg | ORAL_TABLET | Freq: Four times a day (QID) | ORAL | Status: DC | PRN

## 2023-04-05 MED ORDER — MENTHOL 3 MG MT LOZG: 1.0000 | LOZENGE | OROMUCOSAL | Status: DC | PRN

## 2023-04-05 MED ORDER — LACTATED RINGERS IV SOLN
INTRAVENOUS | Status: DC
Start: 2023-04-05 — End: 2023-04-05

## 2023-04-05 MED ORDER — LORATADINE 10 MG PO TABS: 10.0000 mg | ORAL_TABLET | Freq: Every day | ORAL | Status: DC | PRN

## 2023-04-05 MED ORDER — PROPOFOL 10 MG/ML IV BOLUS
INTRAVENOUS | Status: DC | PRN
  Administered 2023-04-05: 150 mg via INTRAVENOUS

## 2023-04-05 MED ORDER — SUGAMMADEX SODIUM 200 MG/2ML IV SOLN
INTRAVENOUS | Status: DC | PRN
Start: 2023-04-05 — End: 2023-04-05
  Administered 2023-04-05: 200 mg via INTRAVENOUS

## 2023-04-05 MED ORDER — BUPIVACAINE LIPOSOME 1.3 % IJ SUSP
INTRAMUSCULAR | Status: DC | PRN
  Administered 2023-04-05: 10 mL via PERINEURAL

## 2023-04-05 MED ORDER — ROCURONIUM BROMIDE 100 MG/10ML IV SOLN
INTRAVENOUS | Status: DC | PRN
  Administered 2023-04-05: 40 mg via INTRAVENOUS

## 2023-04-05 MED ORDER — CEFAZOLIN SODIUM-DEXTROSE 2-4 GM/100ML-% IV SOLN
2.0000 g | INTRAVENOUS | Status: AC
  Administered 2023-04-05: 2 g via INTRAVENOUS
  Filled 2023-04-05: qty 100

## 2023-04-05 MED ORDER — GLYCOPYRROLATE 0.2 MG/ML IJ SOLN
INTRAMUSCULAR | Status: AC
  Filled 2023-04-05: qty 1

## 2023-04-05 MED ORDER — METOCLOPRAMIDE HCL 5 MG PO TABS
5.0000 mg | ORAL_TABLET | Freq: Three times a day (TID) | ORAL | Status: DC | PRN
Start: 2023-04-05 — End: 2023-04-06

## 2023-04-05 MED ORDER — FENTANYL CITRATE (PF) 100 MCG/2ML IJ SOLN
INTRAMUSCULAR | Status: AC
  Filled 2023-04-05: qty 2

## 2023-04-05 MED ORDER — EPHEDRINE 5 MG/ML INJ
INTRAVENOUS | Status: AC
  Filled 2023-04-05: qty 5

## 2023-04-05 MED ORDER — PHENYLEPHRINE HCL-NACL 20-0.9 MG/250ML-% IV SOLN
INTRAVENOUS | Status: DC | PRN
  Administered 2023-04-05: 50 ug/min via INTRAVENOUS

## 2023-04-05 MED ORDER — MORPHINE SULFATE (PF) 2 MG/ML IV SOLN
0.5000 mg | INTRAVENOUS | Status: DC | PRN
Start: 2023-04-05 — End: 2023-04-06

## 2023-04-05 MED ORDER — HYDROCODONE-ACETAMINOPHEN 5-325 MG PO TABS: 1.0000 | ORAL_TABLET | ORAL | Status: DC | PRN

## 2023-04-05 MED ORDER — FENTANYL CITRATE (PF) 100 MCG/2ML IJ SOLN
INTRAMUSCULAR | Status: DC | PRN
  Administered 2023-04-05: 50 ug via INTRAVENOUS

## 2023-04-05 MED ORDER — PROPRANOLOL HCL 20 MG PO TABS
10.0000 mg | ORAL_TABLET | Freq: Every day | ORAL | Status: DC
  Administered 2023-04-06: 10 mg via ORAL
  Filled 2023-04-05: qty 1

## 2023-04-05 MED ORDER — ACETAMINOPHEN 10 MG/ML IV SOLN: 1000.0000 mg | Freq: Once | INTRAVENOUS | Status: DC | PRN

## 2023-04-05 MED ORDER — CEFAZOLIN SODIUM-DEXTROSE 2-4 GM/100ML-% IV SOLN
2.0000 g | Freq: Four times a day (QID) | INTRAVENOUS | Status: AC
  Administered 2023-04-05 (×2): 2 g via INTRAVENOUS
  Filled 2023-04-05 (×2): qty 100

## 2023-04-05 MED ORDER — PHENOL 1.4 % MT LIQD: 1.0000 | OROMUCOSAL | Status: DC | PRN

## 2023-04-05 MED ORDER — METHOCARBAMOL 1000 MG/10ML IJ SOLN: 500.0000 mg | Freq: Four times a day (QID) | INTRAMUSCULAR | Status: DC | PRN

## 2023-04-05 MED ORDER — DEXAMETHASONE SODIUM PHOSPHATE 10 MG/ML IJ SOLN
INTRAMUSCULAR | Status: DC | PRN
  Administered 2023-04-05: 10 mg via INTRAVENOUS

## 2023-04-05 MED ORDER — SIMVASTATIN 40 MG PO TABS: 40.0000 mg | ORAL_TABLET | Freq: Every evening | ORAL | Status: DC

## 2023-04-05 MED ORDER — ONDANSETRON 4 MG PO TBDP: 4.0000 mg | ORAL_TABLET | Freq: Three times a day (TID) | ORAL | 0 refills | Status: DC | PRN

## 2023-04-05 MED ORDER — CHLORHEXIDINE GLUCONATE 0.12 % MT SOLN
15.0000 mL | Freq: Once | OROMUCOSAL | Status: DC
Start: 2023-04-05 — End: 2023-04-05

## 2023-04-05 MED ORDER — FENTANYL CITRATE PF 50 MCG/ML IJ SOSY: 25.0000 ug | PREFILLED_SYRINGE | INTRAMUSCULAR | Status: DC | PRN

## 2023-04-05 MED ORDER — METOCLOPRAMIDE HCL 5 MG/ML IJ SOLN: 5.0000 mg | Freq: Three times a day (TID) | INTRAMUSCULAR | Status: DC | PRN

## 2023-04-05 MED ORDER — ONDANSETRON HCL 4 MG/2ML IJ SOLN
INTRAMUSCULAR | Status: DC | PRN
  Administered 2023-04-05: 4 mg via INTRAVENOUS

## 2023-04-05 MED ORDER — VANCOMYCIN HCL 1000 MG IV SOLR
INTRAVENOUS | Status: DC | PRN
  Administered 2023-04-05: 1000 mg via TOPICAL

## 2023-04-05 MED ORDER — VANCOMYCIN HCL 1000 MG IV SOLR
INTRAVENOUS | Status: AC
  Filled 2023-04-05: qty 20

## 2023-04-05 MED ORDER — LIDOCAINE HCL (CARDIAC) PF 100 MG/5ML IV SOSY
PREFILLED_SYRINGE | INTRAVENOUS | Status: DC | PRN
  Administered 2023-04-05: 100 mg via INTRAVENOUS

## 2023-04-05 MED ORDER — ONDANSETRON HCL 4 MG/2ML IJ SOLN: 4.0000 mg | Freq: Once | INTRAMUSCULAR | Status: DC | PRN

## 2023-04-05 MED ORDER — 0.9 % SODIUM CHLORIDE (POUR BTL) OPTIME
TOPICAL | Status: DC | PRN
  Administered 2023-04-05 (×2): 1000 mL

## 2023-04-05 MED ORDER — TRANEXAMIC ACID-NACL 1000-0.7 MG/100ML-% IV SOLN
1000.0000 mg | Freq: Once | INTRAVENOUS | Status: AC
  Administered 2023-04-05: 1000 mg via INTRAVENOUS
  Filled 2023-04-05: qty 100

## 2023-04-05 MED ORDER — ISOPROPYL ALCOHOL 70 % SOLN
Status: AC
Start: 2023-04-05 — End: ?
  Filled 2023-04-05: qty 480

## 2023-04-05 MED ORDER — EPHEDRINE SULFATE-NACL 50-0.9 MG/10ML-% IV SOSY
PREFILLED_SYRINGE | INTRAVENOUS | Status: DC | PRN
Start: 2023-04-05 — End: 2023-04-05
  Administered 2023-04-05: 5 mg via INTRAVENOUS
  Administered 2023-04-05 (×3): 10 mg via INTRAVENOUS

## 2023-04-05 MED ORDER — HYDROCODONE-ACETAMINOPHEN 7.5-325 MG PO TABS
1.0000 | ORAL_TABLET | ORAL | Status: DC | PRN
  Administered 2023-04-06: 1 via ORAL
  Filled 2023-04-05: qty 1

## 2023-04-05 MED ORDER — ACETAMINOPHEN 325 MG PO TABS: 325.0000 mg | ORAL_TABLET | Freq: Four times a day (QID) | ORAL | Status: DC | PRN

## 2023-04-05 MED ORDER — ACETAMINOPHEN 500 MG PO TABS
500.0000 mg | ORAL_TABLET | Freq: Four times a day (QID) | ORAL | Status: AC
  Administered 2023-04-05 – 2023-04-06 (×3): 500 mg via ORAL
  Filled 2023-04-05 (×4): qty 1

## 2023-04-05 MED ORDER — PHENYLEPHRINE 80 MCG/ML (10ML) SYRINGE FOR IV PUSH (FOR BLOOD PRESSURE SUPPORT)
PREFILLED_SYRINGE | INTRAVENOUS | Status: DC | PRN
  Administered 2023-04-05: 100 ug via INTRAVENOUS
  Administered 2023-04-05: 250 ug via INTRAVENOUS

## 2023-04-05 SURGICAL SUPPLY — 66 items
BAG COUNTER SPONGE SURGICOUNT (BAG) IMPLANT
BAG ZIPLOCK 12X15 (MISCELLANEOUS) ×2 IMPLANT
BASEPLATE AUG MED W-TAPER (Plate) IMPLANT
BIT DRILL 2.7 W/STOP DISP (BIT) IMPLANT
BIT DRILL TWIST 2.7 (BIT) IMPLANT
BLADE SAG 18X100X1.27 (BLADE) ×2 IMPLANT
CEMENT BONE DEPUY (Cement) IMPLANT
COMP HUM UNI IDENTI 36 +3 (Shoulder) IMPLANT
COOLER ICEMAN CLASSIC (MISCELLANEOUS) ×2 IMPLANT
COVER BACK TABLE 60X90IN (DRAPES) ×2 IMPLANT
COVER SURGICAL LIGHT HANDLE (MISCELLANEOUS) ×2 IMPLANT
DRAPE SHEET LG 3/4 BI-LAMINATE (DRAPES) ×2 IMPLANT
DRAPE SURG 17X11 SM STRL (DRAPES) ×2 IMPLANT
DRAPE SURG ORHT 6 SPLT 77X108 (DRAPES) ×4 IMPLANT
DRAPE TOP 10253 STERILE (DRAPES) ×2 IMPLANT
DRAPE U-SHAPE 47X51 STRL (DRAPES) ×2 IMPLANT
DRSG AQUACEL AG ADV 3.5X 6 (GAUZE/BANDAGES/DRESSINGS) IMPLANT
DRSG AQUACEL AG ADV 3.5X10 (GAUZE/BANDAGES/DRESSINGS) ×2 IMPLANT
DURAPREP 26ML APPLICATOR (WOUND CARE) IMPLANT
ELECT REM PT RETURN 15FT ADLT (MISCELLANEOUS) ×2 IMPLANT
FACESHIELD WRAPAROUND (MASK) ×1 IMPLANT
FACESHIELD WRAPAROUND OR TEAM (MASK) ×2 IMPLANT
GLENOID SPHERE 36MM CVD +3 (Orthopedic Implant) IMPLANT
GLOVE BIO SURGEON STRL SZ7.5 (GLOVE) ×8 IMPLANT
GLOVE BIOGEL PI IND STRL 8 (GLOVE) ×4 IMPLANT
GOWN STRL REUS W/ TWL XL LVL3 (GOWN DISPOSABLE) ×4 IMPLANT
GUIDE RSA SHLD BM ROT L SU (ORTHOPEDIC DISPOSABLE SUPPLIES) IMPLANT
KIT BASIN OR (CUSTOM PROCEDURE TRAY) ×2 IMPLANT
KIT TURNOVER KIT A (KITS) IMPLANT
MANIFOLD NEPTUNE II (INSTRUMENTS) ×2 IMPLANT
NDL TAPERED W/ NITINOL LOOP (MISCELLANEOUS) IMPLANT
NEEDLE TAPERED W/ NITINOL LOOP (MISCELLANEOUS) IMPLANT
NS IRRIG 1000ML POUR BTL (IV SOLUTION) ×2 IMPLANT
PACK SHOULDER (CUSTOM PROCEDURE TRAY) ×2 IMPLANT
PAD CAST 4YDX4 CTTN HI CHSV (CAST SUPPLIES) ×2 IMPLANT
PAD COLD SHLDR WRAP-ON (PAD) ×2 IMPLANT
PIN THREADED REVERSE (PIN) IMPLANT
REAMER GUIDE BUSHING SURG DISP (MISCELLANEOUS) IMPLANT
REAMER GUIDE W/SCREW AUG (MISCELLANEOUS) IMPLANT
RESTRAINT HEAD UNIVERSAL NS (MISCELLANEOUS) IMPLANT
SCREW BONE LOCKING 4.75X30X3.5 (Screw) IMPLANT
SCREW BONE STRL 6.5MMX25MM (Screw) IMPLANT
SCREW BONE STRL 6.5MMX30MM (Screw) IMPLANT
SCREW LOCKING 4.75MMX15MM (Screw) IMPLANT
SCREW LOCKING NS 4.75MMX20MM (Screw) IMPLANT
SCREW LOCKING STRL 4.75X25X3.5 (Screw) IMPLANT
SLING ARM FOAM STRAP MED (SOFTGOODS) IMPLANT
SLING ARM IMMOBILIZER MED (SOFTGOODS) IMPLANT
SMARTMIX MINI TOWER (MISCELLANEOUS)
SPONGE T-LAP 4X18 ~~LOC~~+RFID (SPONGE) IMPLANT
STEM HUM SHLD 8 STL (Stem) IMPLANT
STRIP CLOSURE SKIN 1/2X4 (GAUZE/BANDAGES/DRESSINGS) ×2 IMPLANT
SUCTION TUBE FRAZIER 10FR DISP (SUCTIONS) ×2 IMPLANT
SUT FIBERWIRE #2 38 T-5 BLUE (SUTURE)
SUT MAXBRAID #2 CVD NDL (SUTURE) IMPLANT
SUT MNCRL AB 3-0 PS2 18 (SUTURE) ×2 IMPLANT
SUT VIC AB 0 CT1 36 (SUTURE) ×2 IMPLANT
SUT VIC AB 1 CT1 36 (SUTURE) ×2 IMPLANT
SUT VIC AB 2-0 CT1 TAPERPNT 27 (SUTURE) ×2 IMPLANT
SUTURE FIBERWR #2 38 T-5 BLUE (SUTURE) IMPLANT
SUTURE TAPE 1.3 40 TPR END (SUTURE) ×2 IMPLANT
SUTURETAPE 1.3 40 TPR END (SUTURE) ×1
TOWEL OR 17X26 10 PK STRL BLUE (TOWEL DISPOSABLE) ×2 IMPLANT
TOWER SMARTMIX MINI (MISCELLANEOUS) IMPLANT
TRAY HUMERAL NEUTRAL EXT 6 (Shoulder) IMPLANT
TUBE SUCTION HIGH CAP CLEAR NV (SUCTIONS) ×2 IMPLANT

## 2023-04-05 NOTE — Anesthesia Procedure Notes (Signed)
 Anesthesia Regional Block: Interscalene brachial plexus block   Pre-Anesthetic Checklist: , timeout performed,  Correct Patient, Correct Site, Correct Laterality,  Correct Procedure, Correct Position, site marked,  Risks and benefits discussed,  Surgical consent,  Pre-op evaluation,  At surgeon's request and post-op pain management  Laterality: Upper and Left  Prep: Maximum Sterile Barrier Precautions used, chloraprep       Needles:  Injection technique: Single-shot  Needle Type: Echogenic Needle     Needle Length: 5cm  Needle Gauge: 21     Additional Needles:   Procedures:,,,, ultrasound used (permanent image in chart),,    Narrative:  Start time: 04/05/2023 7:05 AM End time: 04/05/2023 7:10 AM Injection made incrementally with aspirations every 5 mL.  Performed by: Personally  Anesthesiologist: Jefm Garnette LABOR, MD  Additional Notes: Block assessed prior to procedure. Patient tolerated procedure well.

## 2023-04-05 NOTE — Anesthesia Postprocedure Evaluation (Signed)
 Anesthesia Post Note  Patient: [name] [name]  Procedure(s) Performed: REVERSE SHOULDER ARTHROPLASTY (Left: Shoulder)     Patient location during evaluation: PACU Anesthesia Type: Regional and General Level of consciousness: awake and alert Pain management: pain level controlled Vital Signs Assessment: post-procedure vital signs reviewed and stable Respiratory status: spontaneous breathing, nonlabored ventilation, respiratory function stable and patient connected to nasal cannula oxygen Cardiovascular status: blood pressure returned to baseline and stable Postop Assessment: no apparent nausea or vomiting Anesthetic complications: no   No notable events documented.  Last Vitals:  Vitals:   04/05/23 1015 04/05/23 1030  BP: 125/74 127/76  Pulse: (!) 49 (!) 47  Resp: 15 13  Temp: (!) 36.4 C   SpO2: 96% 92%    Last Pain:  Vitals:   04/05/23 1015  TempSrc:   PainSc: 0-No pain                 Garnette DELENA Gab

## 2023-04-05 NOTE — Discharge Instructions (Signed)
 Orthopedic surgery discharge instructions:  -Maintain postoperative bandage until follow-up appointment.  This is waterproof, and you may begin showering on postoperative day #3.  Do not submerge underwater.  Maintain that bandage until your follow-up appointment in 2 weeks.  -No lifting over 2 pounds with operateive arm.  You may use the arm immediately for activities of daily living such as bathing, washing your face and brushing your teeth, eating, and getting dressed.  Otherwise maintain your sling when you are out of the house and sleeping.  -Apply ice liberally to the shoulder throughout the day.  For mild to moderate pain use Tylenol and Advil as needed around-the-clock.  For breakthrough pain use oxycodone as necessary.  -You will return to see Dr. Aundria Rud in the office in 2 weeks for routine postoperative check with x-rays.

## 2023-04-05 NOTE — H&P (Signed)
 ORTHOPAEDIC H and P  REQUESTING PHYSICIAN: Sharl Selinda Dover, MD  PCP:  Loreli Kins, MD  Chief Complaint: Left shoulder OA  HPI: [name] [name] is a 83 y.o. male who complains of left shoulder pain and dysfunction.  Here today for reverse arthroplasty.  No new complaints.  Past Medical History:  Diagnosis Date   AAA (abdominal aortic aneurysm) (HCC)    Arthritis    fingers  (02/09/2016)   Basal cell carcinoma of cheek 2012   Chronic low back pain    pt. denies   DDD (degenerative disc disease), lumbar    History of kidney stones    Hyperlipidemia    Tremor    Tremors of nervous system    fine tremors per pt and some involuntary head movement per pt   Trigger finger    left hand   Past Surgical History:  Procedure Laterality Date   ABDOMINAL AORTIC ANEURYSM REPAIR     ABDOMINAL AORTIC ENDOVASCULAR STENT GRAFT  02/09/2016   ABDOMINAL AORTIC ENDOVASCULAR STENT GRAFT N/A 02/09/2016   Procedure: ABDOMINAL AORTIC ENDOVASCULAR STENT GRAFT;  Surgeon: Lonni GORMAN Blade, MD;  Location: [facility] OR;  Service: Vascular;  Laterality: N/A;   COLONOSCOPY     MENISCUS REPAIR Right 1960s X 2   MOHS SURGERY Bilateral 2020,2017   cheeks   REVERSE SHOULDER ARTHROPLASTY Right 01/20/2019   Procedure: REVERSE SHOULDER ARTHROPLASTY;  Surgeon: Sharl Selinda Dover, MD;  Location: [facility] OR;  Service: Orthopedics;  Laterality: Right;  2.5 hrs   TOOTH EXTRACTION  10/2012   put me out to pull 1 tooth   Social History   Socioeconomic History   Marital status: Widowed    Spouse name: Not on file   Number of children: 2   Years of education: Not on file   Highest education level: Bachelor's degree (e.g., BA, AB, BS)  Occupational History    Comment: retired  Tobacco Use   Smoking status: Former    Current packs/day: 0.00    Average packs/day: 2.5 packs/day for 33.0 years (82.5 ttl pk-yrs)    Types: Cigarettes    Start date: 03/26/1965    Quit date: 03/26/1998    Years since  quitting: 25.0   Smokeless tobacco: Never  Vaping Use   Vaping status: Never Used  Substance and Sexual Activity   Alcohol  use: Yes    Alcohol /week: 4.0 standard drinks of alcohol     Types: 2 Glasses of wine, 2 Shots of liquor per week    Comment: once pe rweek   Drug use: No   Sexual activity: Not Currently  Other Topics Concern   Not on file  Social History Narrative   Lives alone   Caffeine- 2 c coffee   Social Drivers of Corporate Investment Banker Strain: Not on file  Food Insecurity: Not on file  Transportation Needs: Not on file  Physical Activity: Not on file  Stress: Not on file  Social Connections: Unknown (07/19/2022)   [facility]   Social Network    Social Network: Not on file   Family History  Problem Relation Age of Onset   Cancer Mother 17       Ovarian   Heart disease Father 91       Endocarditis   Other Brother        plane crash   Allergies  Allergen Reactions   Bee Venom Hives, Swelling and Rash    Yellow jackets  Prior to Admission medications   Medication Sig Start Date End Date Taking? Authorizing Provider  Apoaequorin (PREVAGEN EXTRA STRENGTH) 20 MG CAPS Take 20 mg by mouth daily.   Yes [provider]  aspirin  EC 81 MG tablet Take 81 mg by mouth daily. Swallow whole.   Yes [provider]  Biotin 89999 MCG TABS Take 10,000 mg by mouth daily.   Yes [provider]  cholecalciferol  (VITAMIN D ) 1000 UNITS tablet Take 1,000 Units by mouth daily.   Yes [provider]  cholestyramine (QUESTRAN) 4 g packet Take 4 g by mouth daily. 02/07/23  Yes [provider]  Garlic  1200 MG CAPS Take 1,200 mg by mouth daily.   Yes [provider]  GLUCOSAMINE-CHONDROITIN PO Take 1,200 mg by mouth daily. Omega xl   Yes [provider]  ibuprofen  (ADVIL ,MOTRIN ) 200 MG tablet Take 400 mg by mouth daily as needed for headache or moderate pain.    Yes [provider]  loratadine  (CLARITIN ) 10 MG tablet Take 10 mg by mouth daily as needed for allergies.   Yes [provider]  meloxicam (MOBIC) 7.5 MG tablet Take 7.5 mg by mouth daily. 06/06/22  Yes [provider]  Multiple Vitamins-Minerals (CENTRUM SILVER PO) Take 1 tablet by mouth daily. 50+   Yes [provider]  Multiple Vitamins-Minerals (OCUVITE ADULT 50+) CAPS Take 1 capsule by mouth daily.   Yes [provider]  Omega-3 Fatty Acids (FISH OIL) 1200 MG CAPS Take 1,200 mg by mouth daily.   Yes [provider]  Polyethyl Glycol-Propyl Glycol (SYSTANE ULTRA OP) Place 1 drop into both eyes daily as needed (Dry eyes).   Yes [provider]  propranolol  (INDERAL ) 10 MG tablet Take 10 mg by mouth daily.   Yes [provider]  sildenafil (VIAGRA) 100 MG tablet Take 100 mg by mouth as needed for erectile dysfunction.   Yes [provider]  simvastatin  (ZOCOR ) 40 MG tablet Take 40 mg by mouth every evening.    Yes [provider]   No results found.  Positive ROS: All other systems have been reviewed and were otherwise negative with the exception of those mentioned in the HPI and as above.  Physical Exam: General: Alert, no acute distress Cardiovascular: No pedal edema Respiratory: No cyanosis, no use of accessory musculature GI: No organomegaly, abdomen is soft and non-tender Skin: No lesions in the area of chief complaint Neurologic: Sensation intact distally Psychiatric: Patient is competent for consent with normal mood and affect Lymphatic: No axillary or cervical lymphadenopathy  MUSCULOSKELETAL: LUE-  wwp, nvi  Assessment: Left shoulder Rotator cuff arthropathy  Plan: - proceed today with reverse shoulder replacement left shoulder  - The risks, benefits, and alternatives were discussed with the patient. There are risks associated with the surgery including, but not limited to, problems with anesthesia  (death), infection, \n, fracture of bones, loosening or failure of implants, malunion, nonunion, hematoma (blood accumulation) which may require surgical drainage, blood clots, pulmonary embolism, nerve injury (foot drop), and blood vessel injury. The patient understands these risks and elects to proceed.  - home post op    Selinda Belvie Gosling, MD Cell [phone]    04/05/2023 6:28 AM

## 2023-04-05 NOTE — Progress Notes (Signed)
 Patient reported rash on operative site form Benzoyl Gel.  Patient stated it started on day 3 after using for 2 days prior.  Patient stopped using.  Dr Sharl notifed this morning of rash and stated he is Ok to proceed with case today.    Harlene Prey BSN, Radio Producer - Perioperative Services [place] [phone]

## 2023-04-05 NOTE — Transfer of Care (Signed)
 Immediate Anesthesia Transfer of Care Note  Patient: LAFAYETTE DUNLEVY  Procedure(s) Performed: REVERSE SHOULDER ARTHROPLASTY (Left: Shoulder)  Patient Location: PACU  Anesthesia Type:General  Level of Consciousness: sedated  Airway & Oxygen Therapy: Patient Spontanous Breathing and Patient connected to face mask oxygen  Post-op Assessment: Report given to RN and Post -op Vital signs reviewed and stable  Post vital signs: Reviewed and stable  Last Vitals:  Vitals Value Taken Time  BP 121/70 04/05/23 0915  Temp    Pulse 46 04/05/23 0916  Resp 13 04/05/23 0916  SpO2 100 % 04/05/23 0916  Vitals shown include unfiled device data.  Last Pain:  Vitals:   04/05/23 0620  TempSrc: Oral  PainSc:          Complications: No notable events documented.

## 2023-04-05 NOTE — Brief Op Note (Signed)
 04/05/2023  9:28 AM  PATIENT:  Garnette JINNY Citron  83 y.o. male  PRE-OPERATIVE DIAGNOSIS:  Left shoulder rotator cuff arthropathy  POST-OPERATIVE DIAGNOSIS:  Left shoulder rotator cuff arthropathy  PROCEDURE:  Procedure(s) with comments: REVERSE SHOULDER ARTHROPLASTY (Left) - 120  SURGEON:  Surgeons and Role:    DEWAINE Gosling, Selinda Dover, MD - Primary  PHYSICIAN ASSISTANT: Dayle Moores, PA-C  ANESTHESIA:   regional and general  EBL:  100 mL   BLOOD ADMINISTERED:none  DRAINS: none   LOCAL MEDICATIONS USED:  NONE  SPECIMEN:  No Specimen  DISPOSITION OF SPECIMEN:  N/A  COUNTS:  YES  TOURNIQUET:  * No tourniquets in log *  DICTATION: .Note written in EPIC  PLAN OF CARE: Admit for overnight observation  PATIENT DISPOSITION:  PACU - hemodynamically stable.   Delay start of Pharmacological VTE agent (>24hrs) due to surgical blood loss or risk of bleeding: not applicable

## 2023-04-05 NOTE — Op Note (Signed)
 04/05/2023  9:31 AM  PATIENT:  [name] [name]    PRE-OPERATIVE DIAGNOSIS:  Left shoulder rotator cuff arthropathy  POST-OPERATIVE DIAGNOSIS:  Same  PROCEDURE: Left REVERSE SHOULDER ARTHROPLASTY  SURGEON:  Selinda Belvie Gosling, MD  ASSISTANT: Dayle Moores, PA-C   Assistant attestation:  The [facility] present for the entire procedure.  ANESTHESIA:   General With interscalene block with Exparel   ESTIMATED BLOOD LOSS: 100  PREOPERATIVE INDICATIONS:  [name] [name] is a  83 y.o. male with a diagnosis of Left shoulder rotator cuff arthropathy who failed conservative measures and elected for surgical management.    The risks benefits and alternatives were discussed with the patient preoperatively including but not limited to the risks of infection, bleeding, nerve injury, cardiopulmonary complications, the need for revision surgery, dislocation, brachial plexus palsy, incomplete relief of pain, among others, and the patient was willing to proceed.  OPERATIVE IMPLANTS:  Zimmer identity size 8 stem with a -6 extended tray and a +3 flat identity polyethylene liner  On the glenoid side a medium Biomet comprehensive mini baseplate with the augment oriented at about the 11:30 position.  A 25 mm central screw and 4 peripheral locking screws.   OPERATIVE FINDINGS:  Advanced rotator cuff arthropathy of left shoulder with high riding humeral head and acetabular rotation of the acromion.  Deficient rotator cuff tendons x 3.  Teres minor was intact.  There was full-thickness chondral loss on the humeral head but intact cartilage on the glenoid.  The long head of the biceps tendon was extremely flat and longitudinally torn.  OPERATIVE PROCEDURE: The patient was brought to the operating room and placed in the supine position. General anesthesia was administered. IV antibiotics were given. A Foley was not placed. Time out was performed. The upper extremity was prepped and draped in usual sterile  fashion. The patient was in a beachchair position. Deltopectoral approach was carried out. The biceps was tenodesed to the pectoralis tendon with #2 Fiberwire. The subscapularis remnant was released off of the bone.   I then performed circumferential releases of the humerus, and then dislocated the head, and then reamed with the reamer to the above named size.  I then applied the jig, and cut the humeral head in 30 of retroversion, and then turned my attention to the glenoid.  Deep retractors were placed, and I resected the labrum, and then placed a guidepin into the center position on the glenoid, with slight inferior inclination.  We did use the 3D printed guides to help access the center position of the glenoid vault given his degree of deformity and superior wear consistent with rotator cuff arthropathy.  We then oriented the medium augment at 1130.  I then reamed over the guidepin, and this created a small metaphyseal cancellus blush inferiorly, removing just the cartilage to the subchondral bone superiorly. The base plate was selected and impacted place, and then I secured it centrally with a nonlocking screw, and I had excellent purchase both inferiorly and superiorly. I placed a short locking screws on anterior and posterior aspects.  I then turned my attention to the glenosphere, and impacted this into place, placing slight inferior offset (set on A).   The glenoid sphere was completely seated, and had engagement of the [facility] taper. I then turned my attention back to the humerus.  I sequentially broached, and then trialed, and was found to restore soft tissue tension, and it had 2 finger tightness. Therefore the above named components were selected. The shoulder  felt stable throughout functional motion.   I then impacted the real prosthesis into place, as well as the real humeral tray, and reduced the shoulder. The shoulder had excellent motion, and was stable, and I irrigated the wounds  copiously.   There was no subscapularis to repair.  He had a scar remnant that was opened up.  However no tendon or muscle tear.  I then irrigated the shoulder copiously once more, placed 1 g of vancomycin  powder into the wound.  Repaired the deltopectoral interval with #2 FiberWire followed by subcutaneous Vicryl, then monocryl for the skin,  with Steri-Strips and sterile gauze for the skin. The patient was awakened and returned back in stable and satisfactory condition. There no complications and they tolerated the procedure well.  All counts were correct x2.   Disposition:  [name] [name] will be admitted for overnight observation.  Pain control and monitoring overnight.  Discharge home tomorrow.

## 2023-04-05 NOTE — Anesthesia Procedure Notes (Signed)
 Procedure Name: Intubation Date/Time: 04/05/2023 7:25 AM  Performed by: Carleton Garnette SAUNDERS, CRNAPre-anesthesia Checklist: Patient identified, Emergency Drugs available, Suction available, Patient being monitored and Timeout performed Patient Re-evaluated:Patient Re-evaluated prior to induction Oxygen Delivery Method: Circle system utilized Preoxygenation: Pre-oxygenation with 100% oxygen Induction Type: IV induction Ventilation: Mask ventilation without difficulty Laryngoscope Size: Mac and 3 Grade View: Grade I Tube type: Oral Tube size: 7.5 mm Number of attempts: 1 Airway Equipment and Method: Stylet Placement Confirmation: ETT inserted through vocal cords under direct vision, positive ETCO2 and breath sounds checked- equal and bilateral Secured at: 21 cm Tube secured with: Tape Dental Injury: Teeth and Oropharynx as per pre-operative assessment

## 2023-04-06 ENCOUNTER — Other Ambulatory Visit (HOSPITAL_COMMUNITY): Payer: Self-pay

## 2023-04-06 DIAGNOSIS — M12812 Other specific arthropathies, not elsewhere classified, left shoulder: Secondary | ICD-10-CM | POA: Diagnosis not present

## 2023-04-06 DIAGNOSIS — Z85828 Personal history of other malignant neoplasm of skin: Secondary | ICD-10-CM | POA: Diagnosis not present

## 2023-04-06 DIAGNOSIS — Z79899 Other long term (current) drug therapy: Secondary | ICD-10-CM | POA: Diagnosis not present

## 2023-04-06 DIAGNOSIS — Z96611 Presence of right artificial shoulder joint: Secondary | ICD-10-CM | POA: Diagnosis not present

## 2023-04-06 DIAGNOSIS — Z87891 Personal history of nicotine dependence: Secondary | ICD-10-CM | POA: Diagnosis not present

## 2023-04-06 MED ORDER — OXYCODONE HCL 5 MG PO TABS
5.0000 mg | ORAL_TABLET | Freq: Four times a day (QID) | ORAL | 0 refills | Status: DC | PRN
  Filled 2023-04-06: qty 20, 5d supply, fill #0

## 2023-04-06 MED ORDER — OXYCODONE HCL 5 MG PO TABS: 5.0000 mg | ORAL_TABLET | Freq: Four times a day (QID) | ORAL | 0 refills | Status: DC | PRN

## 2023-04-06 MED ORDER — ONDANSETRON 4 MG PO TBDP
4.0000 mg | ORAL_TABLET | Freq: Three times a day (TID) | ORAL | 0 refills | Status: AC | PRN
  Filled 2023-04-06: qty 20, 7d supply, fill #0

## 2023-04-06 NOTE — Plan of Care (Signed)
  Problem: Education: Goal: Knowledge of General Education information will improve Description: Including pain rating scale, medication(s)/side effects and non-pharmacologic comfort measures Outcome: Progressing   Problem: Health Behavior/Discharge Planning: Goal: Ability to manage health-related needs will improve Outcome: Progressing   Problem: Clinical Measurements: Goal: Ability to maintain clinical measurements within normal limits will improve Outcome: Progressing Goal: Will remain free from infection Outcome: Progressing Goal: Diagnostic test results will improve Outcome: Progressing Goal: Respiratory complications will improve Outcome: Progressing Goal: Cardiovascular complication will be avoided Outcome: Progressing   Problem: Activity: Goal: Risk for activity intolerance will decrease Outcome: Adequate for Discharge   Problem: Nutrition: Goal: Adequate nutrition will be maintained Outcome: Adequate for Discharge   Problem: Coping: Goal: Level of anxiety will decrease Outcome: Progressing   Problem: Elimination: Goal: Will not experience complications related to bowel motility Outcome: Progressing Goal: Will not experience complications related to urinary retention Outcome: Completed/Met   Problem: Pain Management: Goal: General experience of comfort will improve Outcome: Progressing   Problem: Safety: Goal: Ability to remain free from injury will improve Outcome: Progressing   Problem: Skin Integrity: Goal: Risk for impaired skin integrity will decrease Outcome: Progressing   Problem: Education: Goal: Knowledge of General Education information will improve Description: Including pain rating scale, medication(s)/side effects and non-pharmacologic comfort measures Outcome: Adequate for Discharge   Problem: Health Behavior/Discharge Planning: Goal: Ability to manage health-related needs will improve Outcome: Progressing   Problem: Clinical  Measurements: Goal: Ability to maintain clinical measurements within normal limits will improve Outcome: Progressing Goal: Will remain free from infection Outcome: Progressing Goal: Diagnostic test results will improve Outcome: Progressing Goal: Respiratory complications will improve Outcome: Progressing Goal: Cardiovascular complication will be avoided Outcome: Progressing   Problem: Education: Goal: Knowledge of the prescribed therapeutic regimen will improve Outcome: Progressing Goal: Understanding of activity limitations/precautions following surgery will improve Outcome: Progressing Goal: Individualized Educational Video(s) Outcome: Completed/Met   Problem: Activity: Goal: Ability to tolerate increased activity will improve Outcome: Adequate for Discharge   Problem: Pain Management: Goal: Pain level will decrease with appropriate interventions Outcome: Adequate for Discharge   Problem: Education: Goal: Knowledge of the prescribed therapeutic regimen will improve Outcome: Progressing Goal: Understanding of activity limitations/precautions following surgery will improve Outcome: Progressing Goal: Individualized Educational Video(s) Outcome: Completed/Met   Problem: Activity: Goal: Ability to tolerate increased activity will improve Outcome: Progressing   Problem: Pain Management: Goal: Pain level will decrease with appropriate interventions Outcome: Adequate for Discharge

## 2023-04-06 NOTE — Care Management Obs Status (Signed)
 MEDICARE OBSERVATION STATUS NOTIFICATION   Patient Details  Name: [name] [name] MRN: [id number] Date of Birth: 13-Sep-1940   Medicare Observation Status Notification Given:   Yes    Maryjean Ka, LCSW 04/06/2023, 12:34 PM

## 2023-04-06 NOTE — Progress Notes (Signed)
 Occupational Therapy Treatment Patient Details Name: [name] [name] MRN: [id number] DOB: [birth year] Today's Date: 04/06/2023   History of present illness Pt is is an 83 y.o. male who complains of left shoulder pain and dysfunction. PMH includes: AAA (abdominal aortic aneurysm), DDD (degenerative disc disease), tremor, reverse shoulder arthroplasty (right) 2020. Now s/p Left REVERSE SHOULDER ARTHROPLASTY.   OT comments  Pt seen for f/u session primarily to address UB dressing. Son present and involved during session. Pt is for d/c home today.      If plan is discharge home, recommend the following:  A little help with bathing/dressing/bathroom;Assist for transportation;Assistance with cooking/housework   Equipment Recommendations  None recommended by OT    Recommendations for Other Services      Precautions / Restrictions Precautions Precautions: Shoulder Shoulder Interventions: Shoulder sling/immobilizer;At all times;Off for dressing/bathing/exercises Precaution Booklet Issued: Yes (comment) Required Braces or Orthoses: Sling Restrictions Weight Bearing Restrictions Per Provider Order: Yes LUE Weight Bearing Per Provider Order: Non weight bearing       Mobility Bed Mobility Overal bed mobility: Modified Independent                  Transfers Overall transfer level: Needs assistance Equipment used: None Transfers: Sit to/from Stand Sit to Stand: Supervision           General transfer comment: to/from EOB     Balance Overall balance assessment: Modified Independent                                         ADL either performed or assessed with clinical judgement   ADL Overall ADL's : Needs assistance/impaired Eating/Feeding: Set up;Sitting   Grooming: Set up;Sitting   Upper Body Bathing: Minimal assistance;Sitting;Cueing for compensatory techniques   Lower Body Bathing: Minimal assistance;Sit to/from stand;Set up   Upper Body  Dressing : Minimal assistance;Sitting;Set up   Lower Body Dressing: Set up;Minimal assistance;Sit to/from stand   Toilet Transfer: Contact guard assist;Ambulation   Toileting- Clothing Manipulation and Hygiene: Contact guard assist;Sit to/from stand       Functional mobility during ADLs: Supervision/safety;Contact guard assist General ADL Comments: reviewed UB bathing/dressing. Pt reports LB dressing went smoothly with a bit of help prior to arrival of OT.    Extremity/Trunk Assessment Upper Extremity Assessment Upper Extremity Assessment: Right hand dominant;LUE deficits/detail LUE Deficits / Details: s/p reverse shoulder. full ROM e/w/h. shoulder immobilized. LUE: Unable to fully assess due to pain   Lower Extremity Assessment Lower Extremity Assessment: Overall WFL for tasks assessed   Cervical / Trunk Assessment Cervical / Trunk Assessment: Normal    Vision       Perception     Praxis      Cognition Arousal: Alert Behavior During Therapy: WFL for tasks assessed/performed Overall Cognitive Status: Within Functional Limits for tasks assessed                                          Exercises      Shoulder Instructions       General Comments      Pertinent Vitals/ Pain       Pain Assessment Pain Assessment: Faces Faces Pain Scale: Hurts a little bit Pain Location: L shoulder Pain Descriptors / Indicators: Aching Pain Intervention(s): Limited activity within patient's tolerance,  Monitored during session, Repositioned  Home Living Family/patient expects to be discharged to:: Assisted living Living Arrangements: Alone                           Home Equipment: Shower seat;Grab bars - tub/shower;Other (comment) (bed side grab bar)   Additional Comments: 2 sons present during eval. Pt has arranged help for morning and evening.      Prior Functioning/Environment              Frequency  Min 2X/week        Progress  Toward Goals  OT Goals(current goals can now be found in the care plan section)     Acute Rehab OT Goals Patient Stated Goal: values independence OT Goal Formulation: With patient/family Time For Goal Achievement: 04/20/23 Potential to Achieve Goals: Good ADL Goals Pt Will Perform Upper Body Bathing: with modified independence;sitting Pt Will Perform Lower Body Bathing: with modified independence;sit to/from stand Pt Will Perform Upper Body Dressing: with modified independence;sitting Pt Will Perform Lower Body Dressing: with modified independence;sit to/from stand Pt/caregiver will Perform Home Exercise Program: Independently  Plan      Co-evaluation                 AM-PAC OT 6 Clicks Daily Activity     Outcome Measure   Help from another person eating meals?: None Help from another person taking care of personal grooming?: None Help from another person toileting, which includes using toliet, bedpan, or urinal?: A Little Help from another person bathing (including washing, rinsing, drying)?: A Little Help from another person to put on and taking off regular upper body clothing?: A Little Help from another person to put on and taking off regular lower body clothing?: A Little 6 Click Score: 20    End of Session    OT Visit Diagnosis: Unsteadiness on feet (R26.81);Pain Pain - Right/Left: Left Pain - part of body: Shoulder   Activity Tolerance Patient tolerated treatment well   Patient Left with call bell/phone within reach;with family/visitor present (sitting EOB)   Nurse Communication Mobility status        Time: 8697-8688 OT Time Calculation (min): 9 min  Charges: OT General Charges $OT Visit: 1 Visit OT Evaluation $OT Eval Low Complexity: 1 Low OT Treatments $Self Care/Home Management : 8-22 mins  Lamarr Hoots, OT Acute Rehabilitation Services Office: [phone]   Hoots Lamarr DEL 04/06/2023, 1:15 PM

## 2023-04-06 NOTE — Evaluation (Signed)
 Occupational Therapy Evaluation Patient Details Name: [name] [name] MRN: [id number] DOB: [birth year] Today's Date: 04/06/2023   History of Present Illness Pt is is an 83 y.o. male who complains of left shoulder pain and dysfunction. PMH includes: AAA (abdominal aortic aneurysm), DDD (degenerative disc disease), tremor, reverse shoulder arthroplasty (right) 2020. Now s/p Left REVERSE SHOULDER ARTHROPLASTY.   Clinical Impression   Pt admitted with the above diagnoses and presents with below problem list. Pt will benefit from continued acute OT to address the below listed deficits and maximize independence with basic ADLs prior to d/c. At baseline, pt lives in ALF, independent with ADLs, drives. Pt currently needs supervision with functional mobility and transfers; up to min A with UB/LB bathing/dressing. Pt reports he will have help in the morning and evening. Discussed shoulder precautions, compensatory strategies for managing ADLs, and sling education. Pt's 2 sons present throughout session and included in education. Pt c/o mild dizziness walking in the hall, no LOB; tolerated session well otherwise.        If plan is discharge home, recommend the following: A little help with bathing/dressing/bathroom;Assist for transportation;Assistance with cooking/housework    Functional Status Assessment  Patient has had a recent decline in their functional status and demonstrates the ability to make significant improvements in function in a reasonable and predictable amount of time.  Equipment Recommendations  None recommended by OT    Recommendations for Other Services       Precautions / Restrictions Precautions Precautions: Shoulder Shoulder Interventions: Shoulder sling/immobilizer;At all times;Off for dressing/bathing/exercises Precaution Booklet Issued: Yes (comment) Required Braces or Orthoses: Sling Restrictions Weight Bearing Restrictions Per Provider Order: Yes LUE Weight Bearing  Per Provider Order: Non weight bearing      Mobility Bed Mobility Overal bed mobility: Modified Independent                  Transfers Overall transfer level: Needs assistance Equipment used: None Transfers: Sit to/from Stand Sit to Stand: Supervision           General transfer comment: to/from EOB      Balance Overall balance assessment: Modified Independent                                         ADL either performed or assessed with clinical judgement   ADL Overall ADL's : Needs assistance/impaired Eating/Feeding: Set up;Sitting   Grooming: Set up;Sitting   Upper Body Bathing: Minimal assistance;Sitting;Cueing for compensatory techniques   Lower Body Bathing: Minimal assistance;Sit to/from stand;Set up   Upper Body Dressing : Minimal assistance;Sitting;Set up   Lower Body Dressing: Set up;Minimal assistance;Sit to/from stand   Toilet Transfer: Contact guard assist;Ambulation   Toileting- Clothing Manipulation and Hygiene: Contact guard assist;Sit to/from stand       Functional mobility during ADLs: Supervision/safety;Contact guard assist General ADL Comments: Pt walked into the hall a bit with no LOB, mod I (extra time). Reviewed ADL compensatory strategies and precautions. Issued shoulder handout.     Vision         Perception         Praxis         Pertinent Vitals/Pain Pain Assessment Pain Assessment: Faces Faces Pain Scale: Hurts a little bit Pain Location: L shoulder Pain Descriptors / Indicators: Aching Pain Intervention(s): Monitored during session, Limited activity within patient's tolerance, Repositioned     Extremity/Trunk Assessment Upper Extremity  Assessment Upper Extremity Assessment: Right hand dominant;LUE deficits/detail LUE Deficits / Details: s/p reverse shoulder. full ROM e/w/h. shoulder immobilized. LUE: Unable to fully assess due to pain   Lower Extremity Assessment Lower Extremity  Assessment: Overall WFL for tasks assessed   Cervical / Trunk Assessment Cervical / Trunk Assessment: Normal   Communication Communication Communication: No apparent difficulties   Cognition Arousal: Alert Behavior During Therapy: WFL for tasks assessed/performed Overall Cognitive Status: Within Functional Limits for tasks assessed                                       General Comments       Exercises     Shoulder Instructions      Home Living Family/patient expects to be discharged to:: Assisted living Living Arrangements: Alone                           Home Equipment: Shower seat;Grab bars - tub/shower;Other (comment) (bed side grab bar)   Additional Comments: 2 sons present during eval. Pt has arranged help for morning and evening.      Prior Functioning/Environment Prior Level of Function : Independent/Modified Independent;Driving             Mobility Comments: independent ADLs Comments: independent, drives        OT Problem List: Impaired balance (sitting and/or standing);Decreased knowledge of use of DME or AE;Decreased knowledge of precautions;Pain;Impaired UE functional use      OT Treatment/Interventions: Self-care/ADL training;Therapeutic exercise;DME and/or AE instruction;Balance training;Patient/family education;Therapeutic activities    OT Goals(Current goals can be found in the care plan section) Acute Rehab OT Goals Patient Stated Goal: values independence OT Goal Formulation: With patient/family Time For Goal Achievement: 04/20/23 Potential to Achieve Goals: Good ADL Goals Pt Will Perform Upper Body Bathing: with modified independence;sitting Pt Will Perform Lower Body Bathing: with modified independence;sit to/from stand Pt Will Perform Upper Body Dressing: with modified independence;sitting Pt Will Perform Lower Body Dressing: with modified independence;sit to/from stand Pt/caregiver will Perform Home Exercise  Program: Independently  OT Frequency: Min 2X/week    Co-evaluation              AM-PAC OT 6 Clicks Daily Activity     Outcome Measure Help from another person eating meals?: None Help from another person taking care of personal grooming?: None Help from another person toileting, which includes using toliet, bedpan, or urinal?: A Little Help from another person bathing (including washing, rinsing, drying)?: A Little Help from another person to put on and taking off regular upper body clothing?: A Little Help from another person to put on and taking off regular lower body clothing?: A Little 6 Click Score: 20   End of Session Nurse Communication: Mobility status  Activity Tolerance: Patient tolerated treatment well (mild dizziness walking) Patient left: in bed;with call bell/phone within reach;with family/visitor present  OT Visit Diagnosis: Unsteadiness on feet (R26.81);Pain Pain - Right/Left: Left Pain - part of body: Shoulder                Time: 9074-9040 OT Time Calculation (min): 34 min Charges:  OT General Charges $OT Visit: 1 Visit OT Evaluation $OT Eval Low Complexity: 1 Low OT Treatments $Self Care/Home Management : 8-22 mins  Lamarr Hoots, OT Acute Rehabilitation Services Office: [phone]   Hoots Lamarr DEL 04/06/2023, 10:18 AM

## 2023-04-06 NOTE — Progress Notes (Signed)
 Subjective: 1 Day Post-Op Procedure(s) (LRB): REVERSE SHOULDER ARTHROPLASTY (Left)  Patient reports pain as appropriately controlled. Denies any new numbness/tingling.   Objective:   VITALS:  Temp:  [97 F (36.1 C)-99 F (37.2 C)] 99 F (37.2 C) (01/11 9361) Pulse Rate:  [44-64] 63 (01/11 0638) Resp:  [13-17] 17 (01/11 0638) BP: (111-182)/(69-95) 129/74 (01/11 0638) SpO2:  [91 %-100 %] 94 % (01/11 9361)  Gen: AAOx3, NAD Comfortable at rest  Left Upper Extremity: Dressings c/d/i AIN/PIN/Ulnar intact SILT throughout Radial, Ulnar + to palp CR < 2s    LABS No results for input(s): HGB, WBC, PLT in the last 72 hours. No results for input(s): NA, K, CL, CO2, BUN, CREATININE, GLUCOSE in the last 72 hours. No results for input(s): LABPT, INR in the last 72 hours.   Assessment/Plan: 1 Day Post-Op Procedure(s) (LRB): REVERSE SHOULDER ARTHROPLASTY (Left)  Advance diet Up with therapy D/C IV fluids Discharge home with home health  Lillia Mountain 04/06/2023, 8:46 AM

## 2023-04-09 NOTE — Discharge Summary (Signed)
 Patient ID: [name] [name] MRN: [id number] DOB/AGE: 04-14-1940 83 y.o.  Admit date: 04/05/2023 Discharge date: 04/06/2023  Primary Diagnosis: Left rotator cuff arthropathy Admission Diagnoses: Status post left reverse total shoulder arthroplasty Past Medical History:  Diagnosis Date   AAA (abdominal aortic aneurysm) (HCC)    Arthritis    fingers  (02/09/2016)   Basal cell carcinoma of cheek 2012   Chronic low back pain    pt. denies   DDD (degenerative disc disease), lumbar    History of kidney stones    Hyperlipidemia    Tremor    Tremors of nervous system    fine tremors per pt and some involuntary head movement per pt   Trigger finger    left hand   Discharge Diagnoses:   Principal Problem:   S/P reverse total shoulder arthroplasty, left  Estimated body mass index is 22.8 kg/m as calculated from the following:   Height as of this encounter: 5' 5 (1.651 m).   Weight as of this encounter: 62.1 kg.  Procedure:  Procedure(s) (LRB): REVERSE SHOULDER ARTHROPLASTY (Left)   Consults: None  HPI: [name] [name] Is an 83 year old male who was evaluated in the clinic rotator arthropathy.  He should conservatively for a period of time and ultimately failed conservative treatment and was indicated for left reverse total shoulder arthroplasty.  He underwent surgery on 04/05/2023 and was admitted for postoperative monitoring and OT.  Laboratory Data: Hospital Outpatient Visit on 03/29/2023  Component Date Value Ref Range Status   Sodium 03/29/2023 138  135 - 145 mmol/L Final   Potassium 03/29/2023 3.9  3.5 - 5.1 mmol/L Final   Chloride 03/29/2023 106  98 - 111 mmol/L Final   CO2 03/29/2023 24  22 - 32 mmol/L Final   Glucose, Bld 03/29/2023 120 (H)  70 - 99 mg/dL Final   Glucose reference range applies only to samples taken after fasting for at least 8 hours.   BUN 03/29/2023 20  8 - 23 mg/dL Final   Creatinine, Ser 03/29/2023 0.96  0.61 - 1.24 mg/dL Final   Calcium  98/96/7974 9.4  8.9 - 10.3 mg/dL Final   GFR, Estimated 03/29/2023 >60  >60 mL/min Final   Comment: (NOTE) Calculated using the CKD-EPI Creatinine Equation (2021)    Anion gap 03/29/2023 8  5 - 15 Final   Performed at [facility], [address], [place], [place] [address]   WBC 03/29/2023 8.0  4.0 - 10.5 K/uL Final   RBC 03/29/2023 4.85  4.22 - 5.81 MIL/uL Final   Hemoglobin 03/29/2023 15.4  13.0 - 17.0 g/dL Final   HCT 98/96/7974 47.3  39.0 - 52.0 % Final   MCV 03/29/2023 97.5  80.0 - 100.0 fL Final   MCH 03/29/2023 31.8  26.0 - 34.0 pg Final   MCHC 03/29/2023 32.6  30.0 - 36.0 g/dL Final   RDW 98/96/7974 14.1  11.5 - 15.5 % Final   Platelets 03/29/2023 261  150 - 400 K/uL Final   nRBC 03/29/2023 0.0  0.0 - 0.2 % Final   Performed at [facility], [address] [place], [place] [address]   MRSA, PCR 03/29/2023 NEGATIVE  NEGATIVE Final   Staphylococcus aureus 03/29/2023 NEGATIVE  NEGATIVE Final   Comment: (NOTE) The Xpert SA Assay (FDA approved for NASAL specimens in patients 22 years of age and older), is one component of a comprehensive surveillance program. It is not intended to diagnose infection nor to guide or monitor  treatment. Performed at [facility], [address] [place], [place] [address]      X-Rays:DG Shoulder Left Port Result Date: 04/05/2023 CLINICAL DATA:  83 year old male status post shoulder arthroplasty. EXAM: LEFT SHOULDER COMPARISON:  Left shoulder CT 11/27/2022. FINDINGS: Single portable view at 1006 hours. Left total shoulder arthroplasty. Hardware components appear intact, aligned. No unexpected osseous changes. Left lung ventilation appears satisfactory. Partially visible abdominal aortic endograft. IMPRESSION: Left total shoulder arthroplasty with no adverse features. Electronically Signed   By: VEAR Hurst M.D.   On: 04/05/2023 10:33    EKG: Orders placed or performed during the hospital  encounter of 03/29/23   EKG 12 lead per protocol   EKG 12 lead per protocol     Hospital Course: [name] [name] is a 83 y.o. who was admitted to Hospital. They were brought to the operating room on 04/05/2023 and underwent Procedure(s): REVERSE SHOULDER ARTHROPLASTY.  Patient tolerated the procedure well and was later transferred to the recovery room and then to the orthopaedic floor for postoperative care.  They were given PO and IV analgesics for pain control following their surgery.  They were given 24 hours of postoperative antibiotics of  Anti-infectives (From admission, onward)    Start     Dose/Rate Route Frequency Ordered Stop   04/05/23 1400  ceFAZolin  (ANCEF ) IVPB 2g/100 mL premix        2 g 200 mL/hr over 30 Minutes Intravenous Every 6 hours 04/05/23 1301 04/05/23 2021   04/05/23 0843  vancomycin  (VANCOCIN ) powder  Status:  Discontinued          As needed 04/05/23 0844 04/05/23 1300   04/05/23 0600  ceFAZolin  (ANCEF ) IVPB 2g/100 mL premix        2 g 200 mL/hr over 30 Minutes Intravenous On call to O.R. 04/05/23 9445 04/05/23 0732      and started on DVT prophylaxis in the form of Aspirin .  OT was ordered for total joint protocol.  Discharge planning consulted to help with postop disposition and equipment needs.  Patient had an uneventful  night on the evening of surgery.  They started to get up OOB with therapy on day one.the patient had progressed with therapy and meeting their goals.  Patient was seen in rounds and was ready to go home.   Diet: Regular diet Activity:NWB LUE Follow-up:in 2 weeks Disposition - Home Discharged Condition: good   Discharge Instructions     Discharge patient   Complete by: As directed    Discharge disposition: 01-Home or Self Care   Discharge patient date: 04/06/2023      Allergies as of 04/06/2023       Reactions   Bee Venom Hives, Swelling, Rash   Yellow jackets        Medication List     STOP taking these medications     Biotin 10000 MCG Tabs       TAKE these medications    aspirin  EC 81 MG tablet Take 81 mg by mouth daily. Swallow whole.   cholecalciferol  1000 units tablet Commonly known as: VITAMIN D  Take 1,000 Units by mouth daily.   cholestyramine 4 g packet Commonly known as: QUESTRAN Take 4 g by mouth daily.   Fish Oil 1200 MG Caps Take 1,200 mg by mouth daily.   Garlic  1200 MG Caps Take 1,200 mg by mouth daily.   GLUCOSAMINE-CHONDROITIN PO Take 1,200 mg by mouth daily. Omega xl   ibuprofen  200 MG tablet Commonly known  as: ADVIL  Take 400 mg by mouth daily as needed for headache or moderate pain.   loratadine  10 MG tablet Commonly known as: CLARITIN  Take 10 mg by mouth daily as needed for allergies.   meloxicam 7.5 MG tablet Commonly known as: MOBIC Take 7.5 mg by mouth daily.   Ocuvite Adult 50+ Caps Take 1 capsule by mouth daily.   CENTRUM SILVER PO Take 1 tablet by mouth daily. 50+   ondansetron  4 MG disintegrating tablet Commonly known as: ZOFRAN -ODT Take 1 tablet (4 mg total) by mouth every 8 (eight) hours as needed for nausea or vomiting (prn).   Prevagen Extra Strength 20 MG Caps Generic drug: Apoaequorin Take 20 mg by mouth daily.   propranolol  10 MG tablet Commonly known as: INDERAL  Take 10 mg by mouth daily.   sildenafil 100 MG tablet Commonly known as: VIAGRA Take 100 mg by mouth as needed for erectile dysfunction.   simvastatin  40 MG tablet Commonly known as: ZOCOR  Take 40 mg by mouth every evening.   SYSTANE ULTRA OP Place 1 drop into both eyes daily as needed (Dry eyes).       ASK your doctor about these medications    oxyCODONE  5 MG immediate release tablet Commonly known as: Roxicodone  Take 1 tablet (5 mg total) by mouth every 6 (six) hours as needed for severe pain (pain score 7-10) or moderate pain (pain score 4-6) (prn). Ask about: Which instructions should I use?   oxyCODONE  5 MG immediate release tablet Commonly known as: Oxy  IR/ROXICODONE  Take 1 tablet (5 mg total) by mouth every 6 (six) hours as needed. Ask about: Which instructions should I use?         Signed: Dayle Moores PA-C Orthopaedic Surgery 04/09/2023, 10:24 AM

## 2023-04-11 ENCOUNTER — Encounter (HOSPITAL_COMMUNITY): Payer: Self-pay | Admitting: Orthopedic Surgery

## 2023-04-17 DIAGNOSIS — Z4789 Encounter for other orthopedic aftercare: Secondary | ICD-10-CM | POA: Diagnosis not present

## 2023-04-17 DIAGNOSIS — L03115 Cellulitis of right lower limb: Secondary | ICD-10-CM | POA: Diagnosis not present

## 2023-04-17 DIAGNOSIS — M25571 Pain in right ankle and joints of right foot: Secondary | ICD-10-CM | POA: Diagnosis not present

## 2023-05-16 DIAGNOSIS — Z4789 Encounter for other orthopedic aftercare: Secondary | ICD-10-CM | POA: Diagnosis not present

## 2023-05-17 ENCOUNTER — Other Ambulatory Visit: Payer: Self-pay

## 2023-05-17 DIAGNOSIS — I714 Abdominal aortic aneurysm, without rupture, unspecified: Secondary | ICD-10-CM

## 2023-05-29 ENCOUNTER — Ambulatory Visit (HOSPITAL_COMMUNITY)
Admission: RE | Admit: 2023-05-29 | Discharge: 2023-05-29 | Disposition: A | Discharge status: Home / Self Care | Payer: TRICARE For Life (TFL) | Source: Ambulatory Visit | Attending: Vascular Surgery | Admitting: Vascular Surgery

## 2023-05-29 ENCOUNTER — Ambulatory Visit (INDEPENDENT_AMBULATORY_CARE_PROVIDER_SITE_OTHER): Payer: TRICARE For Life (TFL) | Admitting: Physician Assistant

## 2023-05-29 VITALS — BP 151/89 | HR 63 | Temp 98.4°F | Resp 18 | Ht 65.0 in | Wt 142.5 lb

## 2023-05-29 DIAGNOSIS — I714 Abdominal aortic aneurysm, without rupture, unspecified: Secondary | ICD-10-CM | POA: Diagnosis not present

## 2023-05-29 NOTE — Progress Notes (Unsigned)
 Office Note   History of Present Illness   [name] [name] is a 83 y.o. (1940/11/01) male who presents for follow up of AAA/EVAR. Current Outpatient Medications  Medication Sig Dispense Refill   Apoaequorin (PREVAGEN EXTRA STRENGTH) 20 MG CAPS Take 20 mg by mouth daily.     aspirin EC 81 MG tablet Take 81 mg by mouth daily. Swallow whole.     cholecalciferol (VITAMIN D) 1000 UNITS tablet Take 1,000 Units by mouth daily.     cholestyramine (QUESTRAN) 4 g packet Take 4 g by mouth daily.     Garlic 1200 MG CAPS Take 1,200 mg by mouth daily.     GLUCOSAMINE-CHONDROITIN PO Take 1,200 mg by mouth daily. Omega xl     ibuprofen (ADVIL,MOTRIN) 200 MG tablet Take 400 mg by mouth daily as needed for headache or moderate pain.      loratadine (CLARITIN) 10 MG tablet Take 10 mg by mouth daily as needed for allergies.     Multiple Vitamins-Minerals (CENTRUM SILVER PO) Take 1 tablet by mouth daily. 50+     Multiple Vitamins-Minerals (OCUVITE ADULT 50+) CAPS Take 1 capsule by mouth daily.     Omega-3 Fatty Acids (FISH OIL) 1200 MG CAPS Take 1,200 mg by mouth daily.     Polyethyl Glycol-Propyl Glycol (SYSTANE ULTRA OP) Place 1 drop into both eyes daily as needed (Dry eyes).     propranolol (INDERAL) 10 MG tablet Take 10 mg by mouth daily.     sildenafil (VIAGRA) 100 MG tablet Take 100 mg by mouth as needed for erectile dysfunction.     simvastatin (ZOCOR) 40 MG tablet Take 40 mg by mouth every evening.      meloxicam (MOBIC) 7.5 MG tablet Take 7.5 mg by mouth daily. (Patient not taking: Reported on 05/29/2023)     ondansetron (ZOFRAN-ODT) 4 MG disintegrating tablet Take 1 tablet (4 mg total) by mouth every 8 (eight) hours as needed for nausea or vomiting (prn). (Patient not taking: Reported on 05/29/2023) 20 tablet 0   oxyCODONE (OXY IR/ROXICODONE) 5 MG immediate release tablet Take 1 tablet (5 mg total) by mouth every 6 (six) hours as needed. 20 tablet 0   oxyCODONE (ROXICODONE) 5 MG immediate release  tablet Take 1 tablet (5 mg total) by mouth every 6 (six) hours as needed for severe pain (pain score 7-10) or moderate pain (pain score 4-6) (prn). 20 tablet 0   No current facility-administered medications for this visit.    ***REVIEW OF SYSTEMS (negative unless checked):   Cardiac:  []  Chest pain or chest pressure? []  Shortness of breath upon activity? []  Shortness of breath when lying flat? []  Irregular heart rhythm?  Vascular:  []  Pain in calf, thigh, or hip brought on by walking? []  Pain in feet at night that wakes you up from your sleep? []  Blood clot in your veins? []  Leg swelling?  Pulmonary:  []  Oxygen at home? []  Productive cough? []  Wheezing?  Neurologic:  []  Sudden weakness in arms or legs? []  Sudden numbness in arms or legs? []  Sudden onset of difficult speaking or slurred speech? []  Temporary loss of vision in one eye? []  Problems with dizziness?  Gastrointestinal:  []  Blood in stool? []  Vomited blood?  Genitourinary:  []  Burning when urinating? []  Blood in urine?  Psychiatric:  []  Major depression  Hematologic:  []  Bleeding problems? []  Problems with blood clotting?  Dermatologic:  []  Rashes or ulcers?  Constitutional:  []  Fever or chills?  Ear/Nose/Throat:  []  Change in hearing? []  Nose bleeds? []  Sore throat?  Musculoskeletal:  []  Back pain? []  Joint pain? []  Muscle pain?   Physical Examination  *** Vitals:   05/29/23 0914  BP: (!) 151/89  Pulse: 63  Resp: 18  Temp: 98.4 F (36.9 C)  TempSrc: Temporal  SpO2: 98%  Weight: 142 lb 8 oz (64.6 kg)  Height: 5\' 5"  (1.651 m)   ***Body mass index is 23.71 kg/m.  General:  WDWN in NAD; vital signs documented above Gait: Not observed HENT: WNL, normocephalic Pulmonary: normal non-labored breathing , without rales, rhonchi,  wheezing Cardiac: {Desc; regular/irreg:14544} HR, without murmurs {With/Without:20273} carotid bruit*** Abdomen: soft, NT, no masses Skin:  {With/Without:20273} rashes Vascular Exam/Pulses: *** Extremities: {With/Without:20273} ischemic changes, {With/Without:20273} gangrene , {With/Without:20273} cellulitis; {With/Without:20273} open wounds;  Musculoskeletal: no muscle wasting or atrophy  Neurologic: A&O X 3;  No focal weakness or paresthesias are detected Psychiatric:  The pt has {Desc; normal/abnormal:11317::"Normal"} affect.   Non-Invasive Vascular Imaging   AAA Duplex (***) Current size: *** cm Previous size: *** cm (***) R CIA: *** cm L CIA: *** cm   Medical Decision Making   [name] [name] is a 83 y.o. (24-May-1940) male who presents for surveillance of AAA  Based on this patient's duplex, the *** The threshold for repair is AAA size > 5.5 cm, growth > 1 cm/yr, and symptomatic status. The patient will continue to take their *** and follow up with our office in *** months/years with AAA duplex   Loel Dubonnet PA-C [facility] Office: [phone]  Clinic MD: ***

## 2023-06-03 DIAGNOSIS — M6281 Muscle weakness (generalized): Secondary | ICD-10-CM | POA: Diagnosis not present

## 2023-06-03 DIAGNOSIS — Z4789 Encounter for other orthopedic aftercare: Secondary | ICD-10-CM | POA: Diagnosis not present

## 2023-06-03 DIAGNOSIS — M25512 Pain in left shoulder: Secondary | ICD-10-CM | POA: Diagnosis not present

## 2023-06-04 DIAGNOSIS — M25512 Pain in left shoulder: Secondary | ICD-10-CM | POA: Diagnosis not present

## 2023-06-04 DIAGNOSIS — Z4789 Encounter for other orthopedic aftercare: Secondary | ICD-10-CM | POA: Diagnosis not present

## 2023-06-04 DIAGNOSIS — M6281 Muscle weakness (generalized): Secondary | ICD-10-CM | POA: Diagnosis not present

## 2023-06-06 DIAGNOSIS — M25512 Pain in left shoulder: Secondary | ICD-10-CM | POA: Diagnosis not present

## 2023-06-06 DIAGNOSIS — M6281 Muscle weakness (generalized): Secondary | ICD-10-CM | POA: Diagnosis not present

## 2023-06-06 DIAGNOSIS — Z4789 Encounter for other orthopedic aftercare: Secondary | ICD-10-CM | POA: Diagnosis not present

## 2023-06-11 DIAGNOSIS — M25512 Pain in left shoulder: Secondary | ICD-10-CM | POA: Diagnosis not present

## 2023-06-11 DIAGNOSIS — M6281 Muscle weakness (generalized): Secondary | ICD-10-CM | POA: Diagnosis not present

## 2023-06-11 DIAGNOSIS — Z4789 Encounter for other orthopedic aftercare: Secondary | ICD-10-CM | POA: Diagnosis not present

## 2023-06-13 DIAGNOSIS — Z4789 Encounter for other orthopedic aftercare: Secondary | ICD-10-CM | POA: Diagnosis not present

## 2023-06-13 DIAGNOSIS — M25512 Pain in left shoulder: Secondary | ICD-10-CM | POA: Diagnosis not present

## 2023-06-13 DIAGNOSIS — M6281 Muscle weakness (generalized): Secondary | ICD-10-CM | POA: Diagnosis not present

## 2023-06-18 DIAGNOSIS — M6281 Muscle weakness (generalized): Secondary | ICD-10-CM | POA: Diagnosis not present

## 2023-06-18 DIAGNOSIS — M25512 Pain in left shoulder: Secondary | ICD-10-CM | POA: Diagnosis not present

## 2023-06-18 DIAGNOSIS — Z4789 Encounter for other orthopedic aftercare: Secondary | ICD-10-CM | POA: Diagnosis not present

## 2023-06-20 DIAGNOSIS — M25512 Pain in left shoulder: Secondary | ICD-10-CM | POA: Diagnosis not present

## 2023-06-20 DIAGNOSIS — M6281 Muscle weakness (generalized): Secondary | ICD-10-CM | POA: Diagnosis not present

## 2023-06-20 DIAGNOSIS — Z4789 Encounter for other orthopedic aftercare: Secondary | ICD-10-CM | POA: Diagnosis not present

## 2023-06-25 DIAGNOSIS — M6281 Muscle weakness (generalized): Secondary | ICD-10-CM | POA: Diagnosis not present

## 2023-06-25 DIAGNOSIS — M25512 Pain in left shoulder: Secondary | ICD-10-CM | POA: Diagnosis not present

## 2023-06-25 DIAGNOSIS — Z4789 Encounter for other orthopedic aftercare: Secondary | ICD-10-CM | POA: Diagnosis not present

## 2023-06-27 DIAGNOSIS — Z4789 Encounter for other orthopedic aftercare: Secondary | ICD-10-CM | POA: Diagnosis not present

## 2023-06-27 DIAGNOSIS — M25512 Pain in left shoulder: Secondary | ICD-10-CM | POA: Diagnosis not present

## 2023-06-27 DIAGNOSIS — M6281 Muscle weakness (generalized): Secondary | ICD-10-CM | POA: Diagnosis not present

## 2023-07-02 DIAGNOSIS — M6281 Muscle weakness (generalized): Secondary | ICD-10-CM | POA: Diagnosis not present

## 2023-07-02 DIAGNOSIS — Z4789 Encounter for other orthopedic aftercare: Secondary | ICD-10-CM | POA: Diagnosis not present

## 2023-07-02 DIAGNOSIS — M25512 Pain in left shoulder: Secondary | ICD-10-CM | POA: Diagnosis not present

## 2023-07-04 DIAGNOSIS — M6281 Muscle weakness (generalized): Secondary | ICD-10-CM | POA: Diagnosis not present

## 2023-07-04 DIAGNOSIS — Z4789 Encounter for other orthopedic aftercare: Secondary | ICD-10-CM | POA: Diagnosis not present

## 2023-07-04 DIAGNOSIS — M25512 Pain in left shoulder: Secondary | ICD-10-CM | POA: Diagnosis not present

## 2023-07-09 DIAGNOSIS — M25512 Pain in left shoulder: Secondary | ICD-10-CM | POA: Diagnosis not present

## 2023-07-09 DIAGNOSIS — Z4789 Encounter for other orthopedic aftercare: Secondary | ICD-10-CM | POA: Diagnosis not present

## 2023-07-09 DIAGNOSIS — M6281 Muscle weakness (generalized): Secondary | ICD-10-CM | POA: Diagnosis not present

## 2023-07-11 DIAGNOSIS — M25512 Pain in left shoulder: Secondary | ICD-10-CM | POA: Diagnosis not present

## 2023-07-11 DIAGNOSIS — M6281 Muscle weakness (generalized): Secondary | ICD-10-CM | POA: Diagnosis not present

## 2023-07-11 DIAGNOSIS — Z4789 Encounter for other orthopedic aftercare: Secondary | ICD-10-CM | POA: Diagnosis not present

## 2023-07-16 DIAGNOSIS — Z4789 Encounter for other orthopedic aftercare: Secondary | ICD-10-CM | POA: Diagnosis not present

## 2023-07-16 DIAGNOSIS — M25512 Pain in left shoulder: Secondary | ICD-10-CM | POA: Diagnosis not present

## 2023-07-16 DIAGNOSIS — M6281 Muscle weakness (generalized): Secondary | ICD-10-CM | POA: Diagnosis not present

## 2023-07-23 DIAGNOSIS — M25512 Pain in left shoulder: Secondary | ICD-10-CM | POA: Diagnosis not present

## 2023-07-23 DIAGNOSIS — M6281 Muscle weakness (generalized): Secondary | ICD-10-CM | POA: Diagnosis not present

## 2023-07-23 DIAGNOSIS — Z4789 Encounter for other orthopedic aftercare: Secondary | ICD-10-CM | POA: Diagnosis not present

## 2023-07-24 DIAGNOSIS — E782 Mixed hyperlipidemia: Secondary | ICD-10-CM | POA: Diagnosis not present

## 2023-07-24 DIAGNOSIS — M1990 Unspecified osteoarthritis, unspecified site: Secondary | ICD-10-CM | POA: Diagnosis not present

## 2023-07-25 DIAGNOSIS — M25512 Pain in left shoulder: Secondary | ICD-10-CM | POA: Diagnosis not present

## 2023-07-25 DIAGNOSIS — Z4789 Encounter for other orthopedic aftercare: Secondary | ICD-10-CM | POA: Diagnosis not present

## 2023-07-25 DIAGNOSIS — M6281 Muscle weakness (generalized): Secondary | ICD-10-CM | POA: Diagnosis not present

## 2023-08-13 DIAGNOSIS — Z96612 Presence of left artificial shoulder joint: Secondary | ICD-10-CM | POA: Diagnosis not present

## 2023-08-24 DIAGNOSIS — E782 Mixed hyperlipidemia: Secondary | ICD-10-CM | POA: Diagnosis not present

## 2023-08-24 DIAGNOSIS — M1990 Unspecified osteoarthritis, unspecified site: Secondary | ICD-10-CM | POA: Diagnosis not present

## 2023-09-09 DIAGNOSIS — H04223 Epiphora due to insufficient drainage, bilateral lacrimal glands: Secondary | ICD-10-CM | POA: Diagnosis not present

## 2023-09-09 DIAGNOSIS — H2513 Age-related nuclear cataract, bilateral: Secondary | ICD-10-CM | POA: Diagnosis not present

## 2023-09-17 DIAGNOSIS — L570 Actinic keratosis: Secondary | ICD-10-CM | POA: Diagnosis not present

## 2023-09-17 DIAGNOSIS — D485 Neoplasm of uncertain behavior of skin: Secondary | ICD-10-CM | POA: Diagnosis not present

## 2023-09-17 DIAGNOSIS — C44519 Basal cell carcinoma of skin of other part of trunk: Secondary | ICD-10-CM | POA: Diagnosis not present

## 2023-09-17 DIAGNOSIS — C441122 Basal cell carcinoma of skin of right lower eyelid, including canthus: Secondary | ICD-10-CM | POA: Diagnosis not present

## 2023-09-17 DIAGNOSIS — C44319 Basal cell carcinoma of skin of other parts of face: Secondary | ICD-10-CM | POA: Diagnosis not present

## 2023-09-17 DIAGNOSIS — D2262 Melanocytic nevi of left upper limb, including shoulder: Secondary | ICD-10-CM | POA: Diagnosis not present

## 2023-09-17 DIAGNOSIS — L821 Other seborrheic keratosis: Secondary | ICD-10-CM | POA: Diagnosis not present

## 2023-09-17 DIAGNOSIS — Z85828 Personal history of other malignant neoplasm of skin: Secondary | ICD-10-CM | POA: Diagnosis not present

## 2023-09-17 DIAGNOSIS — D692 Other nonthrombocytopenic purpura: Secondary | ICD-10-CM | POA: Diagnosis not present

## 2023-09-23 DIAGNOSIS — E782 Mixed hyperlipidemia: Secondary | ICD-10-CM | POA: Diagnosis not present

## 2023-09-23 DIAGNOSIS — M1990 Unspecified osteoarthritis, unspecified site: Secondary | ICD-10-CM | POA: Diagnosis not present

## 2023-09-25 DIAGNOSIS — Z1331 Encounter for screening for depression: Secondary | ICD-10-CM | POA: Diagnosis not present

## 2023-09-25 DIAGNOSIS — Z0000 Encounter for general adult medical examination without abnormal findings: Secondary | ICD-10-CM | POA: Diagnosis not present

## 2023-09-25 DIAGNOSIS — R7301 Impaired fasting glucose: Secondary | ICD-10-CM | POA: Diagnosis not present

## 2023-09-25 DIAGNOSIS — M1990 Unspecified osteoarthritis, unspecified site: Secondary | ICD-10-CM | POA: Diagnosis not present

## 2023-09-25 DIAGNOSIS — R197 Diarrhea, unspecified: Secondary | ICD-10-CM | POA: Diagnosis not present

## 2023-09-25 DIAGNOSIS — G250 Essential tremor: Secondary | ICD-10-CM | POA: Diagnosis not present

## 2023-09-25 DIAGNOSIS — J301 Allergic rhinitis due to pollen: Secondary | ICD-10-CM | POA: Diagnosis not present

## 2023-09-25 DIAGNOSIS — I714 Abdominal aortic aneurysm, without rupture, unspecified: Secondary | ICD-10-CM | POA: Diagnosis not present

## 2023-09-25 DIAGNOSIS — E782 Mixed hyperlipidemia: Secondary | ICD-10-CM | POA: Diagnosis not present

## 2023-09-25 LAB — LAB REPORT - SCANNED
A1c: 6
EGFR: 68

## 2023-10-02 ENCOUNTER — Encounter: Payer: Self-pay | Admitting: Family Medicine

## 2023-10-17 ENCOUNTER — Emergency Department (HOSPITAL_BASED_OUTPATIENT_CLINIC_OR_DEPARTMENT_OTHER)
Admission: EM | Admit: 2023-10-17 | Discharge: 2023-10-17 | Disposition: A | Discharge status: Home / Self Care | Attending: Emergency Medicine | Admitting: Emergency Medicine

## 2023-10-17 ENCOUNTER — Encounter (HOSPITAL_BASED_OUTPATIENT_CLINIC_OR_DEPARTMENT_OTHER): Payer: Self-pay | Admitting: Emergency Medicine

## 2023-10-17 ENCOUNTER — Other Ambulatory Visit: Payer: Self-pay

## 2023-10-17 ENCOUNTER — Emergency Department (HOSPITAL_BASED_OUTPATIENT_CLINIC_OR_DEPARTMENT_OTHER)

## 2023-10-17 DIAGNOSIS — R5381 Other malaise: Secondary | ICD-10-CM | POA: Diagnosis not present

## 2023-10-17 DIAGNOSIS — I10 Essential (primary) hypertension: Secondary | ICD-10-CM | POA: Diagnosis not present

## 2023-10-17 DIAGNOSIS — G9389 Other specified disorders of brain: Secondary | ICD-10-CM | POA: Diagnosis not present

## 2023-10-17 DIAGNOSIS — Z20822 Contact with and (suspected) exposure to covid-19: Secondary | ICD-10-CM | POA: Diagnosis not present

## 2023-10-17 DIAGNOSIS — R5383 Other fatigue: Secondary | ICD-10-CM | POA: Diagnosis not present

## 2023-10-17 DIAGNOSIS — Z7982 Long term (current) use of aspirin: Secondary | ICD-10-CM | POA: Diagnosis not present

## 2023-10-17 DIAGNOSIS — R42 Dizziness and giddiness: Secondary | ICD-10-CM | POA: Diagnosis present

## 2023-10-17 DIAGNOSIS — R519 Headache, unspecified: Secondary | ICD-10-CM | POA: Diagnosis not present

## 2023-10-17 DIAGNOSIS — Z79899 Other long term (current) drug therapy: Secondary | ICD-10-CM | POA: Insufficient documentation

## 2023-10-17 LAB — COMPREHENSIVE METABOLIC PANEL WITH GFR
ALT: 27 U/L (ref 0–44)
AST: 28 U/L (ref 15–41)
Albumin: 4.6 g/dL (ref 3.5–5.0)
Alkaline Phosphatase: 60 U/L (ref 38–126)
Anion gap: 13 (ref 5–15)
BUN: 26 mg/dL — ABNORMAL HIGH (ref 8–23)
CO2: 24 mmol/L (ref 22–32)
Calcium: 10.3 mg/dL (ref 8.9–10.3)
Chloride: 104 mmol/L (ref 98–111)
Creatinine, Ser: 1.33 mg/dL — ABNORMAL HIGH (ref 0.61–1.24)
GFR, Estimated: 53 mL/min — ABNORMAL LOW (ref >60)
Glucose, Bld: 112 mg/dL — ABNORMAL HIGH (ref 70–99)
Potassium: 5.1 mmol/L (ref 3.5–5.1)
Sodium: 141 mmol/L (ref 135–145)
Total Bilirubin: 1.6 mg/dL — ABNORMAL HIGH (ref 0.0–1.2)
Total Protein: 7.6 g/dL (ref 6.5–8.1)

## 2023-10-17 LAB — CBC WITH DIFFERENTIAL/PLATELET
Abs Immature Granulocytes: 0.02 K/uL (ref 0.00–0.07)
Basophils Absolute: 0.1 K/uL (ref 0.0–0.1)
Basophils Relative: 1 %
Eosinophils Absolute: 0.3 K/uL (ref 0.0–0.5)
Eosinophils Relative: 3 %
HCT: 47.5 % (ref 39.0–52.0)
Hemoglobin: 16.1 g/dL (ref 13.0–17.0)
Immature Granulocytes: 0 %
Lymphocytes Relative: 21 %
Lymphs Abs: 1.8 K/uL (ref 0.7–4.0)
MCH: 32.3 pg (ref 26.0–34.0)
MCHC: 33.9 g/dL (ref 30.0–36.0)
MCV: 95.2 fL (ref 80.0–100.0)
Monocytes Absolute: 0.8 K/uL (ref 0.1–1.0)
Monocytes Relative: 9 %
Neutro Abs: 5.8 K/uL (ref 1.7–7.7)
Neutrophils Relative %: 66 %
Platelets: 283 K/uL (ref 150–400)
RBC: 4.99 MIL/uL (ref 4.22–5.81)
RDW: 13.4 % (ref 11.5–15.5)
WBC: 8.7 K/uL (ref 4.0–10.5)
nRBC: 0 % (ref 0.0–0.2)

## 2023-10-17 LAB — URINALYSIS, ROUTINE W REFLEX MICROSCOPIC
Bilirubin Urine: NEGATIVE
Glucose, UA: NEGATIVE mg/dL
Hgb urine dipstick: NEGATIVE
Ketones, ur: NEGATIVE mg/dL
Leukocytes,Ua: NEGATIVE
Nitrite: NEGATIVE
Protein, ur: NEGATIVE mg/dL
Specific Gravity, Urine: 1.007 (ref 1.005–1.030)
pH: 5.5 (ref 5.0–8.0)

## 2023-10-17 MED ORDER — SODIUM CHLORIDE 0.9 % IV BOLUS
500.0000 mL | Freq: Once | INTRAVENOUS | Status: AC
  Administered 2023-10-17: 500 mL via INTRAVENOUS

## 2023-10-17 NOTE — ED Triage Notes (Signed)
 Pt caox4 c/o headache, dizziness, malaise x4 days.

## 2023-10-17 NOTE — ED Provider Notes (Signed)
 [place] EMERGENCY DEPARTMENT AT [facility] Provider Note   CSN: [id number] Arrival date & time: 10/17/23  1201     Patient presents with: Dizziness, Headache, and Fatigue   [name] [name] is a 83 y.o. male who presents to the emergency department from urgent care due to headache, dizziness, general malaise for about 4 days.  Patient states that the dizziness is what started first and he has noticed that whenever getting out of bed, bending over to pick things up, as well as getting out of his vehicle.  Patient states that this dizziness happens mostly when moving.  He states that this dizziness is not like the room is spinning he just feels woozy and takes a second to collect himself and then goes on about his normal activity.  Patient denies syncope.  Patient states that he has had a headache but that it is not constant and it comes and goes, he does not describe it as severe, he states he does have a history of headaches which normally go away with Advil .  Patient states that he did take an Advil  this morning and currently no longer has the headache.  Patient denies visual changes or disturbances.  Patient states otherwise he just feels overall tired, states that he has sat on the couch and falling asleep a few times which is not normal for him.  Denies fever, chills, chest pain, shortness of breath, nausea, vomiting, diarrhea.  Of note patient does have a history of an aortic aneurysm which is currently being monitored once yearly, he was diagnosed approximately 6 to 7 years ago per the patient and no significant growth has occurred.  Has medical history significant for AAA, hyperlipidemia, tremor.    Dizziness Associated symptoms: headaches   Headache Associated symptoms: dizziness        Prior to Admission medications   Medication Sig Start Date End Date Taking? Authorizing Provider  Apoaequorin (PREVAGEN EXTRA STRENGTH) 20 MG CAPS Take 20 mg by mouth daily.    [provider]  aspirin  EC 81 MG tablet Take 81 mg by mouth daily. Swallow whole.    [provider]  cholecalciferol  (VITAMIN D ) 1000 UNITS tablet Take 1,000 Units by mouth daily.    [provider]  cholestyramine (QUESTRAN) 4 g packet Take 4 g by mouth daily. 02/07/23   [provider]  Garlic  1200 MG CAPS Take 1,200 mg by mouth daily.    [provider]  GLUCOSAMINE-CHONDROITIN PO Take 1,200 mg by mouth daily. Omega xl    [provider]  ibuprofen  (ADVIL ,MOTRIN ) 200 MG tablet Take 400 mg by mouth daily as needed for headache or moderate pain.     [provider]  loratadine  (CLARITIN ) 10 MG tablet Take 10 mg by mouth daily as needed for allergies.    [provider]  Multiple Vitamins-Minerals (CENTRUM SILVER PO) Take 1 tablet by mouth daily. 50+    [provider]  Multiple Vitamins-Minerals (OCUVITE ADULT 50+) CAPS Take 1 capsule by mouth daily.    [provider]  Omega-3 Fatty Acids (FISH OIL) 1200 MG CAPS Take 1,200 mg by mouth daily.    [provider]  ondansetron  (ZOFRAN -ODT) 4 MG disintegrating tablet Take 1 tablet (4 mg total) by mouth every 8 (eight) hours as needed for nausea or vomiting (prn). Patient not taking: Reported on 05/29/2023 04/06/23   Barton Drape, MD  Polyethyl Glycol-Propyl Glycol (SYSTANE ULTRA OP) Place 1 drop into both eyes daily  as needed (Dry eyes).    [provider]  propranolol  (INDERAL ) 10 MG tablet Take 10 mg by mouth daily.    [provider]  sildenafil (VIAGRA) 100 MG tablet Take 100 mg by mouth as needed for erectile dysfunction.    [provider]  simvastatin  (ZOCOR ) 40 MG tablet Take 40 mg by mouth every evening.     [provider]    Allergies: Bee venom    Review of Systems  Neurological:  Positive for dizziness and headaches.    Updated Vital Signs BP (!) 144/128   Pulse (!) 58   Temp 98 F (36.7 C) (Oral)    Resp 13   Ht 5' 5 (1.651 m)   Wt 65.8 kg   SpO2 100%   BMI 24.13 kg/m   Physical Exam Vitals and nursing note reviewed.  Constitutional:      General: He is awake. He is not in acute distress.    Appearance: Normal appearance. He is well-developed. He is not ill-appearing, toxic-appearing or diaphoretic.  HENT:     Head: Normocephalic and atraumatic.  Eyes:     General: No visual field deficit or scleral icterus.    Extraocular Movements: Extraocular movements intact.     Right eye: Normal extraocular motion and no nystagmus.     Left eye: Normal extraocular motion and no nystagmus.     Pupils: Pupils are equal, round, and reactive to light.  Cardiovascular:     Rate and Rhythm: Normal rate and regular rhythm.  Pulmonary:     Effort: Pulmonary effort is normal. No respiratory distress.     Breath sounds: Normal breath sounds. No wheezing, rhonchi or rales.  Abdominal:     General: There is no distension.     Palpations: Abdomen is soft.     Tenderness: There is no abdominal tenderness. There is no guarding.  Musculoskeletal:        General: Normal range of motion.  Skin:    General: Skin is warm and dry.     Capillary Refill: Capillary refill takes less than 2 seconds.  Neurological:     Mental Status: He is alert.     GCS: GCS eye subscore is 4. GCS verbal subscore is 5. GCS motor subscore is 6.     Cranial Nerves: No dysarthria or facial asymmetry.     Motor: No weakness.     Coordination: Coordination normal.  Psychiatric:        Mood and Affect: Mood normal.        Behavior: Behavior normal. Behavior is cooperative.     (all labs ordered are listed, but only abnormal results are displayed) Labs Reviewed  COMPREHENSIVE METABOLIC PANEL WITH GFR - Abnormal; Notable for the following components:      Result Value   Glucose, Bld 112 (*)    BUN 26 (*)    Creatinine, Ser 1.33 (*)    Total Bilirubin 1.6 (*)    GFR, Estimated 53 (*)    All other components within  normal limits  CBC WITH DIFFERENTIAL/PLATELET  URINALYSIS, ROUTINE W REFLEX MICROSCOPIC    EKG: EKG Interpretation Date/Time:  Thursday October 17 2023 12:19:01 EDT Ventricular Rate:  62 PR Interval:  208 QRS Duration:  84 QT Interval:  442 QTC Calculation: 448 R Axis:   -80  Text Interpretation: Normal sinus rhythm Left anterior fascicular block Confirmed by Ruthe Cornet [phone]) on 10/17/2023 12:35:58 PM  Radiology: CT Head Wo Contrast Result Date:  10/17/2023 CLINICAL DATA:  Headache, increasing frequency or severity EXAM: CT HEAD WITHOUT CONTRAST TECHNIQUE: Contiguous axial images were obtained from the base of the skull through the vertex without intravenous contrast. RADIATION DOSE REDUCTION: This exam was performed according to the departmental dose-optimization program which includes automated exposure control, adjustment of the mA and/or kV according to patient size and/or use of iterative reconstruction technique. COMPARISON:  MRI of the head dated February 22, 2022. 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 The slight difference may be secondary to slice selection and different modality, however. The posterior horn of the left lateral ventricle remains markedly enlarged. There is compensatory atrophy of the left parietal and occipital lobes. There is also global cerebral volume loss. There is no evidence of hemorrhage or infarct. Vascular: Mild calcific atheromatous disease. Skull: Intact and unremarkable. Sinuses/Orbits: The visualized paranasal sinuses are clear. The orbits are unremarkable. Other: None. IMPRESSION: 1. 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 Electronically Signed   By: Evalene Coho M.D.   On: 10/17/2023 13:00      Procedures   Medications Ordered in the ED  sodium chloride  0.9 % bolus 500 mL (0 mLs Intravenous Stopped 10/17/23 1447)                                    Medical Decision Making Amount and/or Complexity of Data Reviewed Labs: ordered. Radiology: ordered.   Patient presents to the ED for concern of dizziness, headache, generalized malaise, this involves an extensive number of treatment options, and is a complaint that carries with it a high risk of complications and morbidity.  The differential diagnosis includes stroke, brain bleed, viral syndrome, sepsis, vertigo, electrolyte abnormality, hypotension, bradycardia, etc.   Co morbidities that complicate the patient evaluation  Age, history of abdominal aortic aneurysm, tremor, chronic low back pain, hyperlipidemia, hypertension   Additional history obtained:  Reviewed urgent care note from earlier today on 10/17/2023 before patient was sent to the emergency department for further workup   Lab Tests:  I Ordered, and personally interpreted labs.  The pertinent results include: CBC unremarkable, CMP - creatinine 1.33, total bili 1.6, estimated GFR 53   Imaging Studies ordered:  I ordered imaging studies including CT head without contrast I independently visualized and interpreted imaging which showed  Questionable slight interval enlargement of a cystic lesion  within the atrium of the left lateral ventricle  Other than this no acute abnormality that could be causing dizziness or fatigue or headache, no evidence of brain bleed I agree with the radiologist interpretation   Cardiac Monitoring:  The patient was maintained on a cardiac monitor.  I personally viewed and interpreted the cardiac monitored which showed an underlying rhythm of: sinus rhythm   Medicines ordered and prescription drug management:  I ordered medication including fluids for the elevated creatinine Reevaluation of the patient after these  medicines showed that the patient improved I have reviewed the patients home medicines and have made adjustments as needed   Test Considered:  CTA dissection protocol: Declined at this time as patient denies chest pain, hemodynamically stable, low clinical suspicion for ruptured aortic aneurysm   Critical Interventions:  None   Problem List / ED Course:  83 year old male, told to go to the emergency department from urgent care due to headache, dizziness, malaise x 4 days Vital signs stable, patient hypertensive Patient alert, neurologically intact, not in acute distress on time of my initial exam When discussing dizziness with patient, patient states that this happens most commonly whenever he is getting out of bed in the morning, getting out of his vehicle, or bending over to pick up an object, patient states that he feels woozy for a few seconds and then goes back to normal, denies the room spinning Patient states that the headache is coming on for the last 4 days, does have a past medical history of headaches, normally gets relief with Advil , denies current headache, states that he took an Advil  this morning and believes that this helped, denies red flag symptoms like severe worsening headache, visual disturbances, stiff neck Patient denies fever, chills, chest pain, shortness of breath, afebrile in the emergency department today, no cough, doubt pneumonia, UA ordered to rule out possible UTI however patient denies urinary symptoms CT head ordered which showed 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  Educated about  cyst on CT of head, patient states that this is already known to him, he states that he will make his primary care provider aware that it may have slightly grown.  Patient overall feeling well and ready for discharge, prior to discharge patient ambulated down hallway to bathroom with no dizziness.  Patient asymptomatic at time of discharge. Return precautions given Patient discharged Based off of workup today, unclear cause of fatigue, dizziness.  I was initially considering orthostatic hypotension, however this was not the case on orthostatic vitals taken today.  Overall workup reassuring, recommend further outpatient evaluation.   Reevaluation:  After the interventions noted above, I reevaluated the patient and found that they have :improved   Social Determinants of Health:  none   Dispostion:  After consideration of the diagnostic results and the patients response to treatment, I feel that the patent would benefit from discharge and follow-up with primary care and specialist as scheduled, sooner if symptoms warrant..       Final diagnoses:  Dizziness  Fatigue, unspecified type  Acute nonintractable headache, unspecified headache type    ED Discharge Orders     None          Janetta Terrall FALCON, PA-C 10/17/23 2122    Ruthe Cornet, DO 10/19/23 [phone]

## 2023-10-17 NOTE — ED Notes (Signed)
 No c/o dizziness or being lightheaded while standing for vitals

## 2023-10-17 NOTE — Discharge Instructions (Signed)
 It was a pleasure taking care of you today.  Based on your history, physical exam, labs, and imaging I feel you are safe for discharge.  Workup today was overall reassuring, your labs may show slight dehydration.  You were given fluids today to help with that, please make sure that you are eating and drinking well at home.  As we discussed on the CT scan of your head the cyst may have grown slightly, or it may have only been due to artifact on the scan today.  Please make your primary care provider aware of this.  Please continue to monitor your symptoms and return to the emergency department if you experience any of the following symptoms included but not limited to fever, chills, chest pain, shortness of breath, visual disturbances, weakness, worsening dizziness, or other concerning symptom.  Please follow-up with your primary care provider and specialist as scheduled, sooner if symptoms warrant.

## 2023-10-24 DIAGNOSIS — E782 Mixed hyperlipidemia: Secondary | ICD-10-CM | POA: Diagnosis not present

## 2023-10-24 DIAGNOSIS — M1990 Unspecified osteoarthritis, unspecified site: Secondary | ICD-10-CM | POA: Diagnosis not present

## 2023-11-11 DIAGNOSIS — G930 Cerebral cysts: Secondary | ICD-10-CM | POA: Diagnosis not present

## 2023-11-11 DIAGNOSIS — R42 Dizziness and giddiness: Secondary | ICD-10-CM | POA: Diagnosis not present

## 2023-11-11 DIAGNOSIS — G250 Essential tremor: Secondary | ICD-10-CM | POA: Diagnosis not present

## 2023-11-24 DIAGNOSIS — E782 Mixed hyperlipidemia: Secondary | ICD-10-CM | POA: Diagnosis not present

## 2023-11-24 DIAGNOSIS — M1990 Unspecified osteoarthritis, unspecified site: Secondary | ICD-10-CM | POA: Diagnosis not present

## 2023-12-23 DIAGNOSIS — Z23 Encounter for immunization: Secondary | ICD-10-CM | POA: Diagnosis not present

## 2023-12-24 DIAGNOSIS — M1990 Unspecified osteoarthritis, unspecified site: Secondary | ICD-10-CM | POA: Diagnosis not present

## 2023-12-24 DIAGNOSIS — E782 Mixed hyperlipidemia: Secondary | ICD-10-CM | POA: Diagnosis not present

## 2024-01-06 ENCOUNTER — Other Ambulatory Visit (HOSPITAL_BASED_OUTPATIENT_CLINIC_OR_DEPARTMENT_OTHER): Payer: Self-pay

## 2024-01-06 DIAGNOSIS — Z23 Encounter for immunization: Secondary | ICD-10-CM | POA: Diagnosis not present

## 2024-01-06 MED ORDER — COMIRNATY 30 MCG/0.3ML IM SUSY
0.3000 mL | PREFILLED_SYRINGE | Freq: Once | INTRAMUSCULAR | 0 refills | Status: AC
  Filled 2024-01-06: qty 0.3, 1d supply, fill #0

## 2024-01-24 DIAGNOSIS — E782 Mixed hyperlipidemia: Secondary | ICD-10-CM | POA: Diagnosis not present

## 2024-01-24 DIAGNOSIS — M1990 Unspecified osteoarthritis, unspecified site: Secondary | ICD-10-CM | POA: Diagnosis not present

## 2024-02-23 DIAGNOSIS — M1990 Unspecified osteoarthritis, unspecified site: Secondary | ICD-10-CM | POA: Diagnosis not present

## 2024-02-23 DIAGNOSIS — E782 Mixed hyperlipidemia: Secondary | ICD-10-CM | POA: Diagnosis not present

## 2024-07-02 ENCOUNTER — Ambulatory Visit

## 2024-07-02 ENCOUNTER — Ambulatory Visit (HOSPITAL_COMMUNITY)
